# Patient Record
Sex: Female | Born: 1960 | Race: Black or African American | Hispanic: No | Marital: Single | Smoking: Former smoker
Health system: Southern US, Community
[De-identification: ages and names within clinical notes are randomized; demographics above are authoritative.]

## PROBLEM LIST (undated history)

## (undated) DIAGNOSIS — Z860100 Personal history of colon polyps, unspecified: Secondary | ICD-10-CM

## (undated) DIAGNOSIS — R590 Localized enlarged lymph nodes: Secondary | ICD-10-CM

## (undated) DIAGNOSIS — T7840XA Allergy, unspecified, initial encounter: Secondary | ICD-10-CM

## (undated) DIAGNOSIS — R739 Hyperglycemia, unspecified: Secondary | ICD-10-CM

## (undated) DIAGNOSIS — K219 Gastro-esophageal reflux disease without esophagitis: Secondary | ICD-10-CM

## (undated) DIAGNOSIS — Z5189 Encounter for other specified aftercare: Secondary | ICD-10-CM

## (undated) DIAGNOSIS — D649 Anemia, unspecified: Secondary | ICD-10-CM

## (undated) DIAGNOSIS — E785 Hyperlipidemia, unspecified: Secondary | ICD-10-CM

## (undated) DIAGNOSIS — D489 Neoplasm of uncertain behavior, unspecified: Secondary | ICD-10-CM

## (undated) HISTORY — PX: APPENDECTOMY: SHX54

## (undated) HISTORY — DX: Hyperlipidemia, unspecified: E78.5

## (undated) HISTORY — DX: Personal history of colonic polyps: Z86.010

## (undated) HISTORY — PX: COLONOSCOPY: SHX174

## (undated) HISTORY — DX: Allergy, unspecified, initial encounter: T78.40XA

## (undated) HISTORY — DX: Gastro-esophageal reflux disease without esophagitis: K21.9

## (undated) HISTORY — DX: Hyperglycemia, unspecified: R73.9

## (undated) HISTORY — PX: TONSILLECTOMY: SUR1361

## (undated) HISTORY — DX: Anemia, unspecified: D64.9

## (undated) HISTORY — PX: ABDOMINAL HYSTERECTOMY: SHX81

## (undated) HISTORY — DX: Encounter for other specified aftercare: Z51.89

## (undated) HISTORY — DX: Neoplasm of uncertain behavior, unspecified: D48.9

## (undated) HISTORY — DX: Localized enlarged lymph nodes: R59.0

## (undated) HISTORY — DX: Personal history of colon polyps, unspecified: Z86.0100

---

## 1995-03-28 DIAGNOSIS — D489 Neoplasm of uncertain behavior, unspecified: Secondary | ICD-10-CM

## 1995-03-28 HISTORY — PX: PARTIAL COLECTOMY: SHX5273

## 1995-03-28 HISTORY — DX: Neoplasm of uncertain behavior, unspecified: D48.9

## 2001-03-27 HISTORY — PX: FETAL BLOOD TRANSFUSION: SHX1602

## 2001-04-05 ENCOUNTER — Inpatient Hospital Stay (HOSPITAL_COMMUNITY): Admission: EM | Admit: 2001-04-05 | Discharge: 2001-04-09 | Payer: Self-pay | Admitting: Emergency Medicine

## 2001-04-05 ENCOUNTER — Encounter: Payer: Self-pay | Admitting: Emergency Medicine

## 2001-04-16 ENCOUNTER — Encounter: Admission: RE | Admit: 2001-04-16 | Discharge: 2001-04-16 | Payer: Self-pay | Admitting: Family Medicine

## 2005-07-11 ENCOUNTER — Encounter: Admission: RE | Admit: 2005-07-11 | Discharge: 2005-07-11 | Payer: Self-pay | Admitting: Family Medicine

## 2005-07-11 ENCOUNTER — Ambulatory Visit: Payer: Self-pay | Admitting: Family Medicine

## 2005-07-12 ENCOUNTER — Inpatient Hospital Stay (HOSPITAL_COMMUNITY): Admission: EM | Admit: 2005-07-12 | Discharge: 2005-07-18 | Payer: Self-pay | Admitting: Emergency Medicine

## 2005-07-12 ENCOUNTER — Ambulatory Visit: Payer: Self-pay | Admitting: Internal Medicine

## 2005-07-17 ENCOUNTER — Ambulatory Visit: Payer: Self-pay | Admitting: Gastroenterology

## 2005-07-20 ENCOUNTER — Ambulatory Visit: Payer: Self-pay | Admitting: Family Medicine

## 2005-07-28 ENCOUNTER — Ambulatory Visit: Payer: Self-pay | Admitting: Family Medicine

## 2005-09-11 ENCOUNTER — Encounter: Admission: RE | Admit: 2005-09-11 | Discharge: 2005-09-11 | Payer: Self-pay | Admitting: Family Medicine

## 2005-11-03 ENCOUNTER — Ambulatory Visit: Payer: Self-pay | Admitting: Internal Medicine

## 2005-12-15 ENCOUNTER — Ambulatory Visit: Payer: Self-pay | Admitting: Family Medicine

## 2006-04-16 ENCOUNTER — Encounter: Admission: RE | Admit: 2006-04-16 | Discharge: 2006-04-16 | Payer: Self-pay | Admitting: Obstetrics & Gynecology

## 2006-11-09 ENCOUNTER — Encounter: Admission: RE | Admit: 2006-11-09 | Discharge: 2006-11-09 | Payer: Self-pay | Admitting: Obstetrics & Gynecology

## 2007-03-15 ENCOUNTER — Ambulatory Visit: Payer: Self-pay | Admitting: Internal Medicine

## 2007-03-18 ENCOUNTER — Telehealth (INDEPENDENT_AMBULATORY_CARE_PROVIDER_SITE_OTHER): Payer: Self-pay | Admitting: *Deleted

## 2007-03-29 ENCOUNTER — Emergency Department (HOSPITAL_COMMUNITY): Admission: EM | Admit: 2007-03-29 | Discharge: 2007-03-29 | Payer: Self-pay | Admitting: Emergency Medicine

## 2007-03-29 ENCOUNTER — Telehealth (INDEPENDENT_AMBULATORY_CARE_PROVIDER_SITE_OTHER): Payer: Self-pay | Admitting: *Deleted

## 2007-05-06 ENCOUNTER — Telehealth (INDEPENDENT_AMBULATORY_CARE_PROVIDER_SITE_OTHER): Payer: Self-pay | Admitting: *Deleted

## 2007-05-21 ENCOUNTER — Ambulatory Visit: Payer: Self-pay | Admitting: Internal Medicine

## 2007-05-24 ENCOUNTER — Telehealth (INDEPENDENT_AMBULATORY_CARE_PROVIDER_SITE_OTHER): Payer: Self-pay | Admitting: *Deleted

## 2007-05-27 ENCOUNTER — Telehealth: Payer: Self-pay | Admitting: Internal Medicine

## 2007-05-28 ENCOUNTER — Ambulatory Visit: Payer: Self-pay | Admitting: Internal Medicine

## 2007-05-28 DIAGNOSIS — R209 Unspecified disturbances of skin sensation: Secondary | ICD-10-CM

## 2007-08-12 ENCOUNTER — Telehealth (INDEPENDENT_AMBULATORY_CARE_PROVIDER_SITE_OTHER): Payer: Self-pay | Admitting: *Deleted

## 2007-08-13 ENCOUNTER — Ambulatory Visit: Payer: Self-pay | Admitting: Internal Medicine

## 2007-09-20 ENCOUNTER — Encounter: Payer: Self-pay | Admitting: Family Medicine

## 2007-11-08 ENCOUNTER — Ambulatory Visit: Payer: Self-pay | Admitting: Internal Medicine

## 2007-12-13 ENCOUNTER — Ambulatory Visit: Payer: Self-pay | Admitting: *Deleted

## 2007-12-13 DIAGNOSIS — E785 Hyperlipidemia, unspecified: Secondary | ICD-10-CM | POA: Insufficient documentation

## 2007-12-13 DIAGNOSIS — D649 Anemia, unspecified: Secondary | ICD-10-CM

## 2007-12-13 DIAGNOSIS — Z860100 Personal history of colon polyps, unspecified: Secondary | ICD-10-CM | POA: Insufficient documentation

## 2008-01-10 ENCOUNTER — Telehealth (INDEPENDENT_AMBULATORY_CARE_PROVIDER_SITE_OTHER): Payer: Self-pay | Admitting: *Deleted

## 2008-02-25 ENCOUNTER — Ambulatory Visit: Payer: Self-pay | Admitting: Internal Medicine

## 2008-02-25 DIAGNOSIS — S139XXA Sprain of joints and ligaments of unspecified parts of neck, initial encounter: Secondary | ICD-10-CM

## 2008-02-25 DIAGNOSIS — L230 Allergic contact dermatitis due to metals: Secondary | ICD-10-CM | POA: Insufficient documentation

## 2008-03-16 ENCOUNTER — Encounter: Admission: RE | Admit: 2008-03-16 | Discharge: 2008-03-16 | Payer: Self-pay | Admitting: Internal Medicine

## 2008-03-19 ENCOUNTER — Ambulatory Visit: Payer: Self-pay | Admitting: Internal Medicine

## 2008-03-19 DIAGNOSIS — D239 Other benign neoplasm of skin, unspecified: Secondary | ICD-10-CM | POA: Insufficient documentation

## 2008-03-27 HISTORY — PX: COLONOSCOPY: SHX174

## 2008-03-31 ENCOUNTER — Encounter (INDEPENDENT_AMBULATORY_CARE_PROVIDER_SITE_OTHER): Payer: Self-pay | Admitting: *Deleted

## 2008-03-31 ENCOUNTER — Ambulatory Visit: Payer: Self-pay | Admitting: Internal Medicine

## 2008-03-31 LAB — CONVERTED CEMR LAB: OCCULT 2: NEGATIVE

## 2008-04-06 ENCOUNTER — Telehealth (INDEPENDENT_AMBULATORY_CARE_PROVIDER_SITE_OTHER): Payer: Self-pay | Admitting: *Deleted

## 2008-05-11 ENCOUNTER — Telehealth (INDEPENDENT_AMBULATORY_CARE_PROVIDER_SITE_OTHER): Payer: Self-pay | Admitting: *Deleted

## 2008-06-19 ENCOUNTER — Ambulatory Visit: Payer: Self-pay | Admitting: Internal Medicine

## 2008-06-19 LAB — CONVERTED CEMR LAB
Cholesterol, target level: 200 mg/dL
HDL goal, serum: 50 mg/dL
LDL Goal: 130 mg/dL

## 2008-06-24 ENCOUNTER — Telehealth (INDEPENDENT_AMBULATORY_CARE_PROVIDER_SITE_OTHER): Payer: Self-pay | Admitting: *Deleted

## 2008-07-08 ENCOUNTER — Ambulatory Visit: Payer: Self-pay | Admitting: Internal Medicine

## 2008-07-22 ENCOUNTER — Ambulatory Visit: Payer: Self-pay | Admitting: Internal Medicine

## 2008-08-10 ENCOUNTER — Encounter (INDEPENDENT_AMBULATORY_CARE_PROVIDER_SITE_OTHER): Payer: Self-pay | Admitting: *Deleted

## 2008-08-10 ENCOUNTER — Ambulatory Visit: Payer: Self-pay | Admitting: Internal Medicine

## 2008-08-17 ENCOUNTER — Telehealth (INDEPENDENT_AMBULATORY_CARE_PROVIDER_SITE_OTHER): Payer: Self-pay | Admitting: *Deleted

## 2008-08-27 ENCOUNTER — Telehealth (INDEPENDENT_AMBULATORY_CARE_PROVIDER_SITE_OTHER): Payer: Self-pay | Admitting: *Deleted

## 2008-09-07 ENCOUNTER — Ambulatory Visit: Payer: Self-pay | Admitting: Cardiology

## 2008-09-07 ENCOUNTER — Encounter: Payer: Self-pay | Admitting: Internal Medicine

## 2008-09-11 ENCOUNTER — Telehealth (INDEPENDENT_AMBULATORY_CARE_PROVIDER_SITE_OTHER): Payer: Self-pay | Admitting: *Deleted

## 2008-09-18 ENCOUNTER — Encounter: Payer: Self-pay | Admitting: Internal Medicine

## 2008-11-17 ENCOUNTER — Ambulatory Visit: Payer: Self-pay | Admitting: Internal Medicine

## 2008-11-17 DIAGNOSIS — F458 Other somatoform disorders: Secondary | ICD-10-CM

## 2008-11-17 DIAGNOSIS — R5381 Other malaise: Secondary | ICD-10-CM

## 2008-11-17 DIAGNOSIS — M25579 Pain in unspecified ankle and joints of unspecified foot: Secondary | ICD-10-CM | POA: Insufficient documentation

## 2008-11-17 DIAGNOSIS — R5383 Other fatigue: Secondary | ICD-10-CM

## 2008-12-08 ENCOUNTER — Ambulatory Visit: Payer: Self-pay | Admitting: Internal Medicine

## 2008-12-13 LAB — CONVERTED CEMR LAB
Albumin: 4.1 g/dL (ref 3.5–5.2)
CO2: 31 meq/L (ref 19–32)
Cholesterol: 237 mg/dL — ABNORMAL HIGH (ref 0–200)
Direct LDL: 145.6 mg/dL
Eosinophils Absolute: 0 10*3/uL (ref 0.0–0.7)
Eosinophils Relative: 1.1 % (ref 0.0–5.0)
HCT: 43 % (ref 36.0–46.0)
HDL: 78.2 mg/dL (ref >39.00)
Lymphs Abs: 1 10*3/uL (ref 0.7–4.0)
Monocytes Absolute: 0.4 10*3/uL (ref 0.1–1.0)
Neutro Abs: 2.4 10*3/uL (ref 1.4–7.7)
Neutrophils Relative %: 62 % (ref 43.0–77.0)
Platelets: 189 10*3/uL (ref 150.0–400.0)
RDW: 13.2 % (ref 11.5–14.6)
Sodium: 141 meq/L (ref 135–145)
TSH: 0.96 microintl units/mL (ref 0.35–5.50)
Total CHOL/HDL Ratio: 3
Total Protein: 7.6 g/dL (ref 6.0–8.3)
Triglycerides: 59 mg/dL (ref 0.0–149.0)
WBC: 3.8 10*3/uL — ABNORMAL LOW (ref 4.5–10.5)

## 2008-12-14 ENCOUNTER — Encounter (INDEPENDENT_AMBULATORY_CARE_PROVIDER_SITE_OTHER): Payer: Self-pay | Admitting: *Deleted

## 2008-12-14 ENCOUNTER — Telehealth (INDEPENDENT_AMBULATORY_CARE_PROVIDER_SITE_OTHER): Payer: Self-pay | Admitting: *Deleted

## 2008-12-24 ENCOUNTER — Ambulatory Visit: Payer: Self-pay | Admitting: Internal Medicine

## 2009-01-21 ENCOUNTER — Ambulatory Visit: Payer: Self-pay | Admitting: Internal Medicine

## 2009-01-21 DIAGNOSIS — M2550 Pain in unspecified joint: Secondary | ICD-10-CM | POA: Insufficient documentation

## 2009-01-21 DIAGNOSIS — R590 Localized enlarged lymph nodes: Secondary | ICD-10-CM

## 2009-01-21 DIAGNOSIS — R079 Chest pain, unspecified: Secondary | ICD-10-CM

## 2009-01-21 HISTORY — DX: Localized enlarged lymph nodes: R59.0

## 2009-01-22 ENCOUNTER — Ambulatory Visit: Payer: Self-pay | Admitting: Internal Medicine

## 2009-01-22 ENCOUNTER — Telehealth (INDEPENDENT_AMBULATORY_CARE_PROVIDER_SITE_OTHER): Payer: Self-pay | Admitting: *Deleted

## 2009-01-22 ENCOUNTER — Encounter: Payer: Self-pay | Admitting: Internal Medicine

## 2009-01-22 ENCOUNTER — Encounter (INDEPENDENT_AMBULATORY_CARE_PROVIDER_SITE_OTHER): Payer: Self-pay | Admitting: *Deleted

## 2009-01-22 DIAGNOSIS — R930 Abnormal findings on diagnostic imaging of skull and head, not elsewhere classified: Secondary | ICD-10-CM

## 2009-01-22 LAB — CONVERTED CEMR LAB
Relative Index: 1 (ref 0.0–2.5)
Sed Rate: 14 mm/hr (ref 0–22)
Total CK: 136 units/L (ref 7–177)

## 2009-01-25 ENCOUNTER — Encounter (INDEPENDENT_AMBULATORY_CARE_PROVIDER_SITE_OTHER): Payer: Self-pay | Admitting: *Deleted

## 2009-01-26 ENCOUNTER — Encounter (INDEPENDENT_AMBULATORY_CARE_PROVIDER_SITE_OTHER): Payer: Self-pay | Admitting: *Deleted

## 2009-02-16 ENCOUNTER — Telehealth (INDEPENDENT_AMBULATORY_CARE_PROVIDER_SITE_OTHER): Payer: Self-pay | Admitting: *Deleted

## 2009-03-01 ENCOUNTER — Telehealth (INDEPENDENT_AMBULATORY_CARE_PROVIDER_SITE_OTHER): Payer: Self-pay | Admitting: *Deleted

## 2009-03-04 ENCOUNTER — Ambulatory Visit: Payer: Self-pay | Admitting: Internal Medicine

## 2009-03-04 DIAGNOSIS — R599 Enlarged lymph nodes, unspecified: Secondary | ICD-10-CM | POA: Insufficient documentation

## 2009-03-22 ENCOUNTER — Telehealth (INDEPENDENT_AMBULATORY_CARE_PROVIDER_SITE_OTHER): Payer: Self-pay | Admitting: *Deleted

## 2009-04-13 ENCOUNTER — Ambulatory Visit: Payer: Self-pay | Admitting: Family Medicine

## 2009-04-13 DIAGNOSIS — J069 Acute upper respiratory infection, unspecified: Secondary | ICD-10-CM | POA: Insufficient documentation

## 2009-04-28 ENCOUNTER — Telehealth (INDEPENDENT_AMBULATORY_CARE_PROVIDER_SITE_OTHER): Payer: Self-pay | Admitting: *Deleted

## 2009-04-29 ENCOUNTER — Encounter: Payer: Self-pay | Admitting: Family Medicine

## 2009-04-30 ENCOUNTER — Encounter: Payer: Self-pay | Admitting: Family Medicine

## 2009-05-17 ENCOUNTER — Encounter (INDEPENDENT_AMBULATORY_CARE_PROVIDER_SITE_OTHER): Payer: Self-pay | Admitting: *Deleted

## 2009-05-17 ENCOUNTER — Ambulatory Visit: Payer: Self-pay | Admitting: Internal Medicine

## 2009-05-18 ENCOUNTER — Telehealth: Payer: Self-pay | Admitting: Internal Medicine

## 2009-05-19 ENCOUNTER — Ambulatory Visit: Payer: Self-pay | Admitting: Internal Medicine

## 2009-05-19 ENCOUNTER — Telehealth (INDEPENDENT_AMBULATORY_CARE_PROVIDER_SITE_OTHER): Payer: Self-pay | Admitting: *Deleted

## 2009-05-19 ENCOUNTER — Telehealth: Payer: Self-pay | Admitting: Internal Medicine

## 2009-05-20 ENCOUNTER — Telehealth (INDEPENDENT_AMBULATORY_CARE_PROVIDER_SITE_OTHER): Payer: Self-pay | Admitting: *Deleted

## 2009-07-14 ENCOUNTER — Telehealth: Payer: Self-pay | Admitting: Internal Medicine

## 2009-09-29 ENCOUNTER — Telehealth: Payer: Self-pay | Admitting: Internal Medicine

## 2010-03-29 ENCOUNTER — Ambulatory Visit
Admission: RE | Admit: 2010-03-29 | Discharge: 2010-03-29 | Payer: Self-pay | Source: Home / Self Care | Attending: Internal Medicine | Admitting: Internal Medicine

## 2010-03-29 ENCOUNTER — Other Ambulatory Visit: Payer: Self-pay | Admitting: Internal Medicine

## 2010-03-29 DIAGNOSIS — M26629 Arthralgia of temporomandibular joint, unspecified side: Secondary | ICD-10-CM | POA: Insufficient documentation

## 2010-03-29 DIAGNOSIS — L659 Nonscarring hair loss, unspecified: Secondary | ICD-10-CM | POA: Insufficient documentation

## 2010-03-30 LAB — T4, FREE: Free T4: 0.86 ng/dL (ref 0.60–1.60)

## 2010-03-30 LAB — TSH: TSH: 0.79 u[IU]/mL (ref 0.35–5.50)

## 2010-04-24 LAB — CONVERTED CEMR LAB
ALT: 36 units/L — ABNORMAL HIGH (ref 0–35)
AST: 29 units/L (ref 0–37)
Albumin: 4.5 g/dL (ref 3.5–5.2)
Alkaline Phosphatase: 74 units/L (ref 39–117)
Basophils Absolute: 0 10*3/uL (ref 0.0–0.1)
CO2: 24 meq/L (ref 19–32)
Glucose, Bld: 83 mg/dL (ref 70–99)
HCT: 43.5 % (ref 36.0–46.0)
Hemoglobin: 15.3 g/dL — ABNORMAL HIGH (ref 12.0–15.0)
Indirect Bilirubin: 1 mg/dL — ABNORMAL HIGH (ref 0.0–0.9)
Lymphs Abs: 1.6 10*3/uL (ref 0.7–4.0)
MCV: 81.5 fL (ref 78.0–100.0)
Monocytes Relative: 9 % (ref 3–12)
Neutro Abs: 2.9 10*3/uL (ref 1.7–7.7)
Neutrophils Relative %: 57 % (ref 43–77)
Platelets: 283 10*3/uL (ref 150–400)
RBC: 5.34 M/uL — ABNORMAL HIGH (ref 3.87–5.11)
RDW: 14.2 % (ref 11.5–15.5)
TSH: 2.292 microintl units/mL (ref 0.350–4.50)
Total Protein: 8.2 g/dL (ref 6.0–8.3)
Troponin I: <0.01 ng/mL (ref <0.06)
WBC: 5.1 10*3/uL (ref 4.0–10.5)

## 2010-04-26 NOTE — Progress Notes (Signed)
Summary: Triage> Medication Concerns  Phone Note Call from Patient Call back at Home Phone [phone]   Caller: Patient Summary of Call: Message left on VM: Patient would like a call to discuss depression medication   Chrae [facility]  September 29, 2009 10:20 AM   Follow-up for Phone Call        lmtcb for follow-up. Follow-up by: Harold Barban,  September 30, 2009 1:13 PM  Additional Follow-up for Phone Call Additional follow up Details #1::        Patient called and would like to come off of her depression meds. She is on Citalopram 20mg . Please advise.  Additional Follow-up by: Harold Barban,  September 30, 2009 3:05 PM    Additional Follow-up for Phone Call Additional follow up Details #2::    decrease by 1/2 pill once daily X 5 days then D/C Follow-up by: Marga Melnick MD,  October 01, 2009 4:06 AM  Additional Follow-up for Phone Call Additional follow up Details #3:: Details for Additional Follow-up Action Taken: Patient notified and notes that she has been up since 12:50 last night. Patient is aware to call back to the office if she has any issues with the above. Additional Follow-up by: Lucious Groves,  October 01, 2009 4:06 PM

## 2010-04-26 NOTE — Medication Information (Signed)
Summary: Prior Authorization & Approval for Clarinex/[employer]  Prior Authorization & Approval for Clarinex/[employer]   Imported By: Lanelle Bal 05/07/2009 09:52:10  _____________________________________________________________________  External Attachment:    Type:   Image     Comment:   External Document

## 2010-04-26 NOTE — Letter (Signed)
Summary: Work Dietitian at [employer]  [address] [place], [place] [address]   Phone: [phone]  Fax: [phone]    Today's Date: May 17, 2009  Name of Patient: [name]  The above named patient had a medical visit today at:  am /2:30 pm.  Please take this into consideration when reviewing the time away from work/school.    Special Instructions:  [ x ] None  [  ] To be off the remainder of today, returning to the normal work / school schedule tomorrow.  [  ] To be off until the next scheduled appointment on ______________________.  [  ] Other ________________________________________________________________ ________________________________________________________________________   Sincerely yours,   Shonna Chock

## 2010-04-26 NOTE — Progress Notes (Signed)
Summary: Refill Request  Phone Note Refill Request Message from:  Pharmacy on [facility] [address]  Refills Requested: Medication #1:  VERAMYST 27.5 MCG/SPRAY SUSP 2 sprays each nostril once daily   Dosage confirmed as above?Dosage Confirmed   Last Refilled: 04/28/2009 Initial call taken by: Harold Barban,  April 28, 2009 3:47 PM  Follow-up for Phone Call        needs prior auth will fax over numbers to call Army Fossa CMA  April 29, 2009 11:52 AM

## 2010-04-26 NOTE — Progress Notes (Signed)
Summary: Antibiotic Concerns  Phone Note Call from Patient Call back at Home Phone [phone]   Caller: Patient Summary of Call: Message left on VM: Patient was rx'ed antibiotic LEVAQUIN 500 MG TABS for sinusitis (see CT Report), patient states this med is expensive and she would like it changed to something different.   I called patient and offered her a 25 dollar saving coupon and she said thats an option IF there is not another Antibiotic that would be cheaper. Patient aware Dr.Hopper is out of the office and I will consult with one of the other Dr.'s and contact her back later./Chrae [facility]  May 20, 2009 9:16 AM   Follow-up for Phone Call        Patient called back and stated she will just stop by and get the 25 dollar savings coupon. Placed at the front for pick-up Follow-up by: Shonna Chock,  May 20, 2009 9:21 AM

## 2010-04-26 NOTE — Progress Notes (Signed)
Summary: refill-hopp  Phone Note Call from Patient Call back at Home Phone [phone]   Caller: Patient Summary of Call: pt states that she is currently having back spasm again and would for dr Arris Meyn to refill med. pt last OV 05-17-09, last filled 06-24-08 #15 1 and removed from list 11-17-08  CYCLOBENZAPRINE HCL 5 MG TABS 1 at bedtime as needed for muscle spasm. [facility] [address]  Initial call taken by: Jeremy Johann CMA,  July 14, 2009 4:20 PM  Follow-up for Phone Call        1-2 at bedtime as needed #14 Follow-up by: Marga Melnick MD,  July 14, 2009 5:03 PM  Additional Follow-up for Phone Call Additional follow up Details #1::        pt left detail messsage rx sent to pharmacy ....................Marland KitchenFelecia Deloach CMA  July 14, 2009 5:08 PM     New/Updated Medications: CYCLOBENZAPRINE HCL 5 MG TABS (CYCLOBENZAPRINE HCL) take 1-2 at bedtime as needed Prescriptions: CYCLOBENZAPRINE HCL 5 MG TABS (CYCLOBENZAPRINE HCL) take 1-2 at bedtime as needed  #14 x 0   Entered by:   Jeremy Johann CMA   Authorized by:   Marga Melnick MD   Signed by:   Jeremy Johann CMA on 07/14/2009   Method used:   [facility]ed to ...       [facility] [phone]* (retail)       [address]       [place], [place]  [address]       Ph: [phone]       Fax: [phone]   RxID:   [id number]

## 2010-04-26 NOTE — Progress Notes (Signed)
Summary: [facility] Concerns  Phone Note Call from Patient Call back at Home Phone [phone]   Caller: Patient Summary of Call: Patient woke up today and her mouth is sore/ right sinus cavity region is very tender. Patient was seen yesterday and ?'s if there is anything futher for her to do or is this something to expect.   Dr.Jeffrie Lofstrom please advise  Initial call taken by: Shonna Chock,  May 18, 2009 11:40 AM  Follow-up for Phone Call        Use Neti pot once daily ;Fluticasone two times a day as we discussed.Zicam  or Zinc lozenges for sore throat   Follow-up by: Marga Melnick MD,  May 18, 2009 12:16 PM  Additional Follow-up for Phone Call Additional follow up Details #1::        pt aware.................Marland KitchenFelecia Deloach CMA  May 18, 2009 12:29 PM

## 2010-04-26 NOTE — Progress Notes (Signed)
Summary: Prior Auth APPROVED clarinex, veramyst [employer]  Phone Note Refill Request Message from:  Pharmacy on [address] Fax # [phone]  Refills Requested: Medication #1:  CLARINEX 5 MG TABS 1 by mouth once daily as needed.   Dosage confirmed as above?Dosage Confirmed  Medication #2:  VERAMYST 27.5 MCG/SPRAY SUSP 2 sprays each nostril once daily Prior Auth [id number]  Initial call taken by: Harold Barban,  April 28, 2009 3:48 PM  Follow-up for Phone Call        prior auth in process [employer] awaiting fax.............Marland KitchenFelecia Deloach CMA  April 29, 2009 1:19 PM   Additional Follow-up for Phone Call Additional follow up Details #1::        prior auth approved veramyst from 04-09-09 until 04-30-11 prior auth approved clarinex from 04-09-09 until 04-30-10 pharmacy faxed, approval letter scan to chart..............Marland KitchenFelecia Deloach CMA  May 03, 2009 9:16 AM

## 2010-04-26 NOTE — Progress Notes (Signed)
Summary: still no better facial pain and tenderness  Phone Note Call from Patient Call back at Home Phone [phone]   Caller: Patient Summary of Call: pt c/o tenderness, pain, in right eye and sinus cavity region . pt states pain increase with mouth movement, and touch. pt has tried ibuprofen,Neti pot once daily ;Fluticasone two times a day with no relief of pain. dr Jazz Biddy pls advise..................................Marland KitchenFelecia Deloach CMA  May 19, 2009 9:47 AM   Follow-up for Phone Call        Stat CT scan of sinuses Follow-up by: Marga Melnick MD,  May 19, 2009 2:43 PM  Additional Follow-up for Phone Call Additional follow up Details #1::        pt aware, CT ordered.................Marland KitchenFelecia Deloach CMA  May 19, 2009 2:55 PM   New Problems: SINUS PAIN (ICD-478.19)   New Problems: SINUS PAIN (ICD-478.19)

## 2010-04-26 NOTE — Progress Notes (Signed)
Summary: nose spray/clainex  Phone Note Call from Patient   Summary of Call: Pt called and stated she would like a refill on the nose spray and the clarinex- I told her I would send in refills but if she was not better by the end of this rx she would need to be seen agian. Army Fossa CMA  April 28, 2009 1:39 PM     Prescriptions: CLARINEX 5 MG TABS (DESLORATADINE) 1 by mouth once daily as needed  #30 x 0   Entered by:   Army Fossa CMA   Authorized by:   Loreen Freud DO   Signed by:   Army Fossa CMA on 04/28/2009   Method used:   Electronically to        [facility] [address] (retail)       [phone]       [place], [place]  [address]       Ph: [phone]       Fax: [phone]   RxID:   [id number] VERAMYST 27.5 MCG/SPRAY SUSP (FLUTICASONE FUROATE) 2 sprays each nostril once daily  #1 x 0   Entered by:   Army Fossa CMA   Authorized by:   Loreen Freud DO   Signed by:   Army Fossa CMA on 04/28/2009   Method used:   Faxed to ...       [facility] [address] (retail)       [phone]       [place], [place]  [address]       Ph: [phone]       Fax: [phone]   RxID:   [id number]

## 2010-04-26 NOTE — Progress Notes (Signed)
Summary: CT Results  Phone Note Call from Patient Call back at Home Phone [phone]   Caller: Patient Summary of Call: Spoke with, patient aware of the instructions below:  Sinusitis: Change Augmentin to Levaquin 500 mg once daily #7; also Prednisone 20 mg two times a day with food #10  Chrae Malloy  May 19, 2009 5:09 PM      New/Updated Medications: LEVAQUIN 500 MG TABS (LEVOFLOXACIN) 1 by mouth once daily x 7days PREDNISONE 20 MG TABS (PREDNISONE) 1 by mouth two times a day Prescriptions: LEVAQUIN 500 MG TABS (LEVOFLOXACIN) 1 by mouth once daily x 7days  #7 x 0   Entered by:   Shonna Chock   Authorized by:   Marga Melnick MD   Signed by:   Shonna Chock on 05/19/2009   Method used:   Electronically to        [facility] [address] [place] (retail)       [phone]       [place], [place]  [address]       Ph: [phone]       Fax: [phone]   RxID:   [id number] PREDNISONE 20 MG TABS (PREDNISONE) 1 by mouth two times a day  #10 x 0   Entered by:   Shonna Chock   Authorized by:   Marga Melnick MD   Signed by:   Shonna Chock on 05/19/2009   Method used:   Electronically to        [facility] [address] [place] (retail)       [phone]       [place], [place]  [address]       Ph: [phone]       Fax: [phone]   RxID:   [id number]

## 2010-04-26 NOTE — Assessment & Plan Note (Signed)
Summary: headache.nausea.left earache/kdc   Vital Signs:  Patient profile:   50 year old female Weight:      164 pounds Temp:     98.6 degrees F oral Pulse rate:   72 / minute Pulse rhythm:   regular BP sitting:   116 / 80  (left arm) Cuff size:   regular  Vitals Entered By: Army Fossa CMA (April 13, 2009 1:58 PM) CC: Pt c/o headache, nausea, ear ache, chills since saturday. , URI symptoms   History of Present Illness:       This is a 51 year old woman who presents with URI symptoms.  The symptoms began 4 days ago.  The patient complains of nasal congestion, clear nasal discharge, dry cough, and earache, but denies purulent nasal discharge.  The patient denies fever, low-grade fever (<100.5 degrees), fever of 100.5-103 degrees, fever of 103.1-104 degrees, fever to >104 degrees, stiff neck, dyspnea, wheezing, rash, vomiting, diarrhea, use of an antipyretic, and response to antipyretic.  The patient also reports itchy throat and sneezing.  The patient denies itchy watery eyes, seasonal symptoms, response to antihistamine, headache, muscle aches, and severe fatigue.  The patient denies the following risk factors for Strep sinusitis: unilateral facial pain, unilateral nasal discharge, poor response to decongestant, double sickening, tooth pain, Strep exposure, tender adenopathy, and absence of cough.    Current Medications (verified): 1)  Sinus Wash Neti Pot 2300-700 Mg  Kit (Sodium Chloride-Sodium Bicarb) .... Prn 2)  Citalopram Hydrobromide 20 Mg Tabs (Citalopram Hydrobromide) .Marland Kitchen.. 1 Once Daily 3)  Tylenol Pm .... As Needed 4)  Zyrtec .... Take As Needed 5)  Divigel 0.25 Mg/0.25gm Gel (Estradiol) .... Rub On Upper Thigh Once Daily 6)  Augmentin 875-125 Mg Tabs (Amoxicillin-Pot Clavulanate) .Marland Kitchen.. 1 By Mouth Two Times A Day 7)  Veramyst 27.5 Mcg/spray Susp (Fluticasone Furoate) .... 2 Sprays Each Nostril Once Daily 8)  Clarinex 5 Mg Tabs (Desloratadine) .Marland Kitchen.. 1 By Mouth Once Daily As  Needed  Allergies: 1)  ! Codeine  Past History:  Past medical, surgical, family and social histories (including risk factors) reviewed for relevance to current acute and chronic problems.  Past Medical History: Reviewed history from 03/04/2009 and no changes required. 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 Hysterectomy & USO-04/2001 for fibroid & severe anemia; Blood transfusion -03/2001 pre TAH; Colon-polyp-1997, 2004;negative  2010, [facility] Tonsillectomy ; SBO 2007 Colectomy, partial for polyp 1997  Family History: Reviewed history from 03/04/2009 and no changes required. Father: colon CA,leukemia Mother: neg Siblings: bro lipids; P uncle DM;MGaunt colon CA Asthma- MGM  Social History: Reviewed history from 03/04/2009 and no changes required. Occupation: Runner, broadcasting/film/video Single Alcohol use-no Regular exercise-yes Former smoker.  Quit in 2003. Smoked on and off x 10 year.  Smoked socially.  Review of Systems      See HPI  Physical Exam  General:  Well-developed,well-nourished,in no acute distress; alert,appropriate and cooperative throughout examination Ears:  External ear exam shows no significant lesions or deformities.  Otoscopic examination reveals clear canals, tympanic membranes are intact bilaterally without bulging, retraction, inflammation or discharge. Hearing is grossly normal bilaterally. Nose:  L frontal sinus tenderness, L maxillary sinus tenderness, R frontal sinus tenderness, and R maxillary sinus tenderness.   Mouth:  Oral mucosa and oropharynx without lesions or  exudates.  Teeth in good repair. Neck:  No deformities, masses, or tenderness noted. Lungs:  Normal respiratory effort, chest expands  symmetrically. Lungs are clear to auscultation, no crackles or wheezes. Heart:  normal rate and no murmur.   Psych:  Cognition and judgment appear intact. Alert and cooperative with normal attention span and concentration. No apparent delusions, illusions, hallucinations   Impression & Recommendations:  Problem # 1:  URI (ICD-465.9)  Instructed on symptomatic treatment. Call if symptoms persist or worsen.   Her updated medication list for this problem includes:    Clarinex 5 Mg Tabs (Desloratadine) .Marland Kitchen... 1 by mouth once daily as needed      veramyst if no better in 3-4 days---augmentin 875 two times a day for 10 days   Complete Medication List: 1)  Sinus Wash Neti Pot 2300-700 Mg Kit (Sodium chloride-sodium bicarb) .... Prn 2)  Citalopram Hydrobromide 20 Mg Tabs (Citalopram hydrobromide) .Marland Kitchen.. 1 once daily 3)  Tylenol Pm  .... As needed 4)  Zyrtec  .... Take as needed 5)  Divigel 0.25 Mg/0.25gm Gel (Estradiol) .... Rub on upper thigh once daily 6)  Augmentin 875-125 Mg Tabs (Amoxicillin-pot clavulanate) .Marland Kitchen.. 1 by mouth two times a day 7)  Veramyst 27.5 Mcg/spray Susp (Fluticasone furoate) .... 2 sprays each nostril once daily 8)  Clarinex 5 Mg Tabs (Desloratadine) .Marland Kitchen.. 1 by mouth once daily as needed Prescriptions: AUGMENTIN 875-125 MG TABS (AMOXICILLIN-POT CLAVULANATE) 1 by mouth two times a day  #20 x 0   Entered and Authorized by:   Loreen Freud DO   Signed by:   Loreen Freud DO on 04/13/2009   Method used:   Print then Give to Patient   RxID:   [id number]

## 2010-04-26 NOTE — Assessment & Plan Note (Signed)
Summary: FLU?KDC   Vital Signs:  Patient profile:   50 year old female Weight:      164.6 pounds Temp:     98.3 degrees F oral Pulse rate:   80 / minute Resp:     14 per minute BP sitting:   100 / 62  (left arm) Cuff size:   large  Vitals Entered By: Shonna Chock (May 17, 2009 3:06 PM) CC: Cough,fever(off/on), body aches, sore throat, discolored drainage headache and diarrhea. Patient states "I feel like Crap" Comments REVIEWED MED LIST, PATIENT AGREED DOSE AND INSTRUCTION CORRECT    Primary Care Provider:  Marga Melnick MD  CC:  Cough, fever(off/on), body aches, sore throat, and discolored drainage headache and diarrhea. Patient states "I feel like Crap".  History of Present Illness: Onset 05/11/2009 as NP cough; 02/18 with yellow sputum & L earache. LB aching w/o myalgias. Rx: Robitussin , Tylenol  Allergies: 1)  ! Codeine  Review of Systems General:  Complains of chills, fever, and sweats. Eyes:  Denies discharge. ENT:  Complains of nasal congestion and sinus pressure; denies ear discharge; Frontal headache, facial pain , dental pain & purulence. Resp:  Complains of sputum productive and wheezing; denies shortness of breath; No PMH of asthma; former smoker.  Physical Exam  General:  in no acute distress; alert,appropriate and cooperative throughout examination Eyes:  No corneal or conjunctival inflammation noted. EOMI. Perrla. Vision grossly normal. Ears:  External ear exam shows no significant lesions or deformities.  Otoscopic examination reveals clear canals, tympanic membranes are intact bilaterally without bulging, retraction, inflammation or discharge. Hearing is grossly normal bilaterally. Nose:  External nasal examination shows no deformity or inflammation. R nare occluded Mouth:  Oral mucosa and oropharynx without lesions or exudates.  Teeth in good repair. Mild pharyngeal erythema.   Lungs:  Normal respiratory effort, chest expands symmetrically. Lungs are  clear to auscultation, no crackles or wheezes. Dry cough Heart:  regular rhythm, no murmur, and bradycardia.   Cervical Nodes:  No lymphadenopathy noted Axillary Nodes:  No palpable lymphadenopathy   Impression & Recommendations:  Problem # 1:  SINUSITIS- ACUTE-NOS (ICD-461.9)  The following medications were removed from the medication list:    Veramyst 27.5 Mcg/spray Susp (Fluticasone furoate) .Marland Kitchen... 2 sprays each nostril once daily Her updated medication list for this problem includes:    Sinus Wash Neti Pot 2300-700 Mg Kit (Sodium chloride-sodium bicarb) .Marland Kitchen... Prn    Amoxicillin-pot Clavulanate 875-125 Mg Tabs (Amoxicillin-pot clavulanate) ..... Q 12 hrs with a meal    Fluticasone Propionate 50 Mcg/act Susp (Fluticasone propionate) .Marland Kitchen... 1 spray two times a day with "crossovver " technique  Problem # 2:  BRONCHITIS-ACUTE (ICD-466.0)  Her updated medication list for this problem includes:    Amoxicillin-pot Clavulanate 875-125 Mg Tabs (Amoxicillin-pot clavulanate) ..... Q 12 hrs with a meal  Complete Medication List: 1)  Sinus Wash Neti Pot 2300-700 Mg Kit (Sodium chloride-sodium bicarb) .... Prn 2)  Citalopram Hydrobromide 20 Mg Tabs (Citalopram hydrobromide) .Marland Kitchen.. 1 once daily 3)  Tylenol Pm  .... As needed 4)  Zyrtec  .... Take as needed 5)  Divigel 0.25 Mg/0.25gm Gel (Estradiol) .... Rub on upper thigh once daily 6)  Amoxicillin-pot Clavulanate 875-125 Mg Tabs (Amoxicillin-pot clavulanate) .... Q 12 hrs with a meal 7)  Fluticasone Propionate 50 Mcg/act Susp (Fluticasone propionate) .Marland Kitchen.. 1 spray two times a day with "crossovver " technique  Patient Instructions: 1)  Neti pot once daily untilsinuses clear. 2)  Drink as much fluid  as you can tolerate for the next few days. 3)  Recommended remaining out of work for  02/21 & 05/18/2009 Prescriptions: FLUTICASONE PROPIONATE 50 MCG/ACT SUSP (FLUTICASONE PROPIONATE) 1 spray two times a day with "crossovver " technique  #1 x 11    Entered and Authorized by:   Marga Melnick MD   Signed by:   Marga Melnick MD on 05/17/2009   Method used:   Faxed to ...       [facility] [phone]* (retail)       [address]       [place], [place]  [address]       Ph: [phone]       Fax: [phone]   RxID:   [id number] AMOXICILLIN-POT CLAVULANATE 875-125 MG TABS (AMOXICILLIN-POT CLAVULANATE) q 12 hrs with a meal  #20 x 0   Entered and Authorized by:   Marga Melnick MD   Signed by:   Marga Melnick MD on 05/17/2009   Method used:   Faxed to ...       [facility] [phone]* (retail)       [address]       [place], [place]  [address]       Ph: [phone]       Fax: [phone]   RxID:   [id number]

## 2010-04-26 NOTE — Medication Information (Signed)
Summary: Prior Authorization & Approval for Veramyst/[employer]  Prior Authorization & Approval for Veramyst/[employer]   Imported By: Lanelle Bal 05/07/2009 09:58:46  _____________________________________________________________________  External Attachment:    Type:   Image     Comment:   External Document

## 2010-04-28 NOTE — Assessment & Plan Note (Signed)
Summary: EARACHE/KN   Vital Signs:  Patient profile:   50 year old female Weight:      168.2 pounds BMI:     28.53 Temp:     98.6 degrees F oral Pulse rate:   72 / minute Resp:     14 per minute BP sitting:   118 / 80  (left arm) Cuff size:   large  Vitals Entered By: Shonna Chock CMA (March 29, 2010 2:53 PM) CC: Left earache x 1 month and discuss citalopram, Ear pain   Primary Care Provider:  Marga Melnick MD  CC:  Left earache x 1 month and discuss citalopram and Ear pain.  History of Present Illness:      This is a 50 year old woman who presents with Ear pain X 1 month.  The patient also reports tinnitus and clear  nasal discharge, but denies ear discharge, sensation of fullness, hearing loss, fever, and sinus pain.  The pain is located in the left ear.  The pain is described as constant.  Associated symptoms include toothache on l .  The patient denies headache.   Rx: ear drops for wax w/o benefit.  Allergies: 1)  ! Codeine  Review of Systems Derm:  Complains of hair loss; denies changes in nail beds and dryness. Neuro:  Denies numbness and tingling. Psych:  Complains of depression.  Physical Exam  General:  well-nourished,in no acute distress; alert,appropriate and cooperative throughout examination Ears:  R ear normal.  L canal with minimal wax. 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 Nasal mucosa are  dry without lesions or exudates. R nare occluded Mouth:  Oral mucosa and oropharynx without lesions or exudates.  Teeth in good repair.  Minimal  uvular  erythema.   Neck:  No deformities, masses, or tenderness noted. Extremities:  No clubbing, cyanosis, edema. No tremor Skin:  Intact without suspicious lesions or rashes Cervical Nodes:  No lymphadenopathy noted Axillary Nodes:  No palpable lymphadenopathy   Impression & Recommendations:  Problem # 1:  TMJ PAIN (ICD-524.62) Assessment Improved  Problem # 2:  URI  (ICD-465.9) extrinsic etiology suggested rather than infectious  Problem # 3:  HAIR LOSS (ICD-704.00)  R/O thyroid dysfunction   Orders: Venipuncture ([address]109) TLB-TSH (Thyroid Stimulating Hormone) (84443-TSH) TLB-T4 (Thyrox), Free [phone])  Complete Medication List: 1)  Sinus Wash Neti Pot 2300-700 Mg Kit (Sodium chloride-sodium bicarb) .... Prn 2)  Citalopram Hydrobromide 20 Mg Tabs (Citalopram hydrobromide) .Marland Kitchen.. 1 once daily 3)  Tylenol Pm  .... As needed 4)  Zyrtec  .... Take as needed 5)  Divigel 0.25 Mg/0.25gm Gel (Estradiol) .... Rub on upper thigh once daily 6)  Fluticasone Propionate 50 Mcg/act Susp (Fluticasone propionate) .Marland Kitchen.. 1 spray two times a day with "crossovver " technique 7)  Oxycodone-acetaminophen 7.5-325 Mg Tabs (Oxycodone-acetaminophen) .Marland Kitchen.. 1 by mouth every 6 hours as needed  Patient Instructions: 1)  Aleve 1-2 every 8-12 hrs as needed . Warm compresses to L jaw three times a day as needed . Avoid tough meat & chewing gum. 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 Netipot once daily - two times a day as needed , followed by Fluticasone. Report pain , pus & fever as discussed.   Orders Added: 1)  Est. Patient Level III [[address]] 2)  Venipuncture [36415] 3)  TLB-TSH (Thyroid Stimulating Hormone) [84443-TSH] 4)  TLB-T4 (Thyrox), Free [82956-OZ3Y]  Appended Document: EARACHE/KN

## 2010-05-16 ENCOUNTER — Telehealth: Payer: Self-pay | Admitting: Internal Medicine

## 2010-05-23 ENCOUNTER — Telehealth: Payer: Self-pay | Admitting: Internal Medicine

## 2010-05-24 NOTE — Progress Notes (Signed)
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 Patient notes that this is not helping and she is hurting pretty bad today. Patient would like prescription called in. Please advise.  Initial call taken by: Lucious Groves CMA,  May 16, 2010 11:15 AM  Follow-up for Phone Call         Does codeine causes nausea ? Follow-up by: Marga Melnick MD,  May 16, 2010 1:57 PM  Additional Follow-up for Phone Call Additional follow up Details #1::        I called patient and the above and she notes that it did in the past but she thinks it was due to not having anything on her stomach. She notes that she taken Hydrocodone without a problem. Lucious Groves CMA  May 16, 2010 2:29 PM     Additional Follow-up for Phone Call Additional follow up Details #2::    Tylenol #3  # 30 1 every 4 hrs as needed  Follow-up by: Marga Melnick MD,  May 16, 2010 4:40 PM  Additional Follow-up for Phone Call Additional follow up Details #3:: Details for Additional Follow-up Action Taken: Patient notified. Lucious Groves CMA  May 16, 2010 4:46 PM   New/Updated Medications: TYLENOL WITH CODEINE #3 300-30 MG TABS (ACETAMINOPHEN-CODEINE) 1 by mouth q 4 hrs as needed Prescriptions: TYLENOL WITH CODEINE #3 300-30 MG TABS (ACETAMINOPHEN-CODEINE) 1 by mouth q 4 hrs as needed  #30 x 0   Entered by:   Lucious Groves CMA   Authorized by:   Marga Melnick MD   Signed by:   Lucious Groves CMA on 05/16/2010   Method used:   Printed then faxed to ...       [facility] [phone]* (retail)       [address]       [place], [place]  [address]       Ph: [phone]       Fax: [phone]   RxID:   [id number]

## 2010-06-02 NOTE — Progress Notes (Signed)
Summary: Med issue  Phone Note Call from Patient Call back at Home Phone [phone]   Summary of Call: Patient called noting that the prescription sent for her makes her to sick to take it (Tylenol 3). Patient notes that she has a great deal of nausea with the med despite having taken it with food. She also notes that she now has a clicking noise in her jaw. Please advise. Initial call taken by: Lucious Groves CMA,  May 23, 2010 11:54 AM  Follow-up for Phone Call        ear/ jaw symptoms present > 1 month . ENT consulatation indicated Follow-up by: Marga Melnick MD,  May 23, 2010 12:48 PM  Additional Follow-up for Phone Call Additional follow up Details #1::        Patient notified. Additional Follow-up by: Lucious Groves CMA,  May 23, 2010 12:54 PM

## 2010-06-10 ENCOUNTER — Telehealth: Payer: Self-pay | Admitting: Internal Medicine

## 2010-06-13 ENCOUNTER — Telehealth: Payer: Self-pay

## 2010-06-14 NOTE — Progress Notes (Signed)
Summary: rx for TMJ  Phone Note Call from Patient   Caller: Patient Summary of Call: Spoke w/ patient still having trouble w/ TMJ and having trouble finding someone to treat is in the process of contacting insurance to see which oral surgeon would be in network. In the meantime would like to know if Dr. Alwyn Ren would prescrib a muscle relaxer or something that will help w/ discomfort.   [facility] [address]  Initial call taken by: Doristine Devoid CMA,  June 10, 2010 4:50 PM  Follow-up for Phone Call        see Rx Follow-up by: Marga Melnick MD,  June 10, 2010 4:56 PM  Additional Follow-up for Phone Call Additional follow up Details #1::        left msg on voicemail prescription sent to pharmacy .Marland KitchenMarland KitchenMarland KitchenDoristine Devoid CMA  June 10, 2010 5:06 PM     New/Updated Medications: CYCLOBENZAPRINE HCL 5 MG TABS (CYCLOBENZAPRINE HCL) 1 two times a day & 2 as needed Prescriptions: CYCLOBENZAPRINE HCL 5 MG TABS (CYCLOBENZAPRINE HCL) 1 two times a day & 2 as needed  #20 x 0   Entered by:   Doristine Devoid CMA   Authorized by:   Marga Melnick MD   Signed by:   Doristine Devoid CMA on 06/10/2010   Method used:   Electronically to        [facility]* (retail)       [phone]       [place], [place]  [address]       Ph: [phone]       Fax: [phone]   RxID:   [id number]

## 2010-06-23 NOTE — Progress Notes (Signed)
Summary: med clarification  Phone Note Call from Patient   Caller: Patient Summary of Call: Pt called and stated that she went to pick up her Flexeril. Pharm would not give her the med because the directions were not clear. Please clarify rx and send to [facility] on [address].  Initial call taken by: Army Fossa CMA,  June 13, 2010 3:51 PM  Follow-up for Phone Call        Dr.Hopper please advise on instruction for Flexeril, pharmacy indicates they are not clear. Would it be better to put 1 by mouth four times daily vs how it is currently written  Follow-up by: Shonna Chock CMA,  June 13, 2010 4:05 PM  Additional Follow-up for Phone Call Additional follow up Details #1::         the prescription should read : one pill every 6-8 hours as needed and 1-2 at bedtime as needed. Daily dose should not exceed 4 pills. Additional Follow-up by: Marga Melnick MD,  June 13, 2010 5:02 PM    Additional Follow-up for Phone Call Additional follow up Details #2::    Change made to rx instruction, left message informing patient rx sent in with corrected instructions./[facility] CMA  June 13, 2010 5:04 PM   New/Updated Medications: CYCLOBENZAPRINE HCL 5 MG TABS (CYCLOBENZAPRINE HCL) one pill every 6-8 hours as needed and 1-2 at bedtime as needed. Daily dose should not exceed 4 pills. Prescriptions: CYCLOBENZAPRINE HCL 5 MG TABS (CYCLOBENZAPRINE HCL) one pill every 6-8 hours as needed and 1-2 at bedtime as needed. Daily dose should not exceed 4 pills.  #20 x 0   Entered by:   Shonna Chock CMA   Authorized by:   Marga Melnick MD   Signed by:   Shonna Chock CMA on 06/13/2010   Method used:   Electronically to        [facility]* (retail)       [phone]       [place], [place]  [address]       Ph: [phone]       Fax: [phone]   RxID:   [id number]

## 2010-08-06 ENCOUNTER — Other Ambulatory Visit: Payer: Self-pay | Admitting: Internal Medicine

## 2010-08-09 NOTE — Assessment & Plan Note (Signed)
[facility]                                 ON-CALL NOTE   CAMELA, WICH                         MRN:          [id number]  DATE:08/10/2008                            DOB:          11-23-1960    Phone number is [phone].   CHIEF COMPLAINT:  Sore throat and sinus symptoms.   She says she went to see Dr. Alwyn Ren today.  She was diagnosed with  allergies, sore throat, and possibly a sinus infection.  She was given  Augmentin 875 mg and told to take Zyrtec.  She has not had her first  dose of Zyrtec yet and she is noting tonight her throat is much more  sore.  It hurts to talk and she is a bit horse.  She does not have any  trouble swallowing or difficulty taking fluid; however, she denies  fever, chills, nausea, vomiting, or other symptoms.  I advised her to go  ahead and continue the Augmentin and that if her sore throat worsens  tonight to the point where she cannot swallow or has trouble handling  her own saliva that she needs to go the emergency room or if she  develops fever, severe headache, or other symptoms.  Otherwise, to go  ahead and take Zyrtec for her nasal symptoms.  She can try Tylenol over  the counter for throat pain and call the office tomorrow if she is not  starting to improve or if she worsens.     Marne A. Tower, MD  Electronically Signed    MAT/MedQ  DD: 08/10/2008  DT: 08/11/2008  Job #: [id number]

## 2010-08-10 ENCOUNTER — Other Ambulatory Visit: Payer: Self-pay | Admitting: Internal Medicine

## 2010-08-11 NOTE — Telephone Encounter (Signed)
Called in, just in case rx did not go though on 08/06/10
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
## 2010-08-12 NOTE — Discharge Summary (Signed)
[name], [name]                ACCOUNT NO.:  [id number]   MEDICAL RECORD NO.:  [id number]          PATIENT TYPE:  INP   LOCATION:  5713                         FACILITY:  [facility]   PHYSICIAN:  Rene Paci, M.D. LHCDATE OF BIRTH:  [birth year]   DATE OF ADMISSION:  07/12/2005  DATE OF DISCHARGE:  07/18/2005                                 DISCHARGE SUMMARY   DISCHARGE DIAGNOSES:  1.  Nausea, vomiting, abdominal pain secondary to partial small bowel      obstruction, likely adhesions, resolved, tolerating p.o. without      problems or pain.  2.  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  Outpatient follow-up      with repeat LFTs in six weeks and Dr. Arlyce Dice, GI if still elevated.  3.  Incidental right breast nodule noted on MRI of abdomen for outpatient      diagnostic mammogram and appropriate follow-up if needed.  4.  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  See above.   DISCHARGE MEDICATIONS:  Ambien 5 mg one p.o. q.h.s. p.r.n.  No other usual  medications.   DISPOSITION:  Patient is discharged home.  Hospital follow-up is with her  primary M.D. in the next 48 hours; Dr. Laury Axon for Thursday, April 26 at 3:15  p.m. to review overall clinical status as well as arrange diagnostic  mammogram and further evaluation.  See MRI report from April 23 for more  details done at [facility].   HOSPITAL COURSE:  #1 - PARTIAL SMALL BOWEL OBSTRUCTION:  Patient is a  pleasant 50 year old woman admitted April 18 with abdominal pain, nausea,  and vomiting over the three days prior.  Abdominal films including a CT of  the abdomen and pelvis were shown to have small bowel obstruction on April  18, but no evidence of cholecystitis to explain elevated LFTs.  As such, a  surgery consultation was obtained and G tube for decompression also helped  to alleviate patient's symptoms and she was seen by GI service, Dr.  Arlyce Dice  also regarding these above findings.  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  LFTs improved and were likely thought to be secondary to medications and/or  Gilbert's disease not requiring further follow-up, but recommend repeat LFT  check in six weeks and follow-up GI if remaining elevated.  After several  days of NG decompressions patient's pain resolved and she was ready to  tolerate p.o.  Her films __________ show complete resolution of the partial  obstruction, though clinically she did well with liquids and advanced to a  low residue diet without difficulty.  She has had no further nausea and  vomiting in the 48-72 hours since removing the NG tube and she is felt  clinically ready for discharge home as she has required no anti-emetics or  pain medications.  No surgical intervention or GI procedures have been  undergone during this hospitalization.   #2 - INCIDENTAL RIGHT BREAST NODULE:  On the MRI of her abdomen done for  evaluation of liver lesions an incidental right breast nodule was noted.  Radiology has recommended diagnostic mammogram which patient is  aware of and will discuss arranging with her primary M.D. later this week.  She is given a small prescription of Ambien associated for insomnia that she  has had during this hospitalization but no other medications have been  prescribed as she takes no medications regularly.      Rene Paci, M.D. [facility]  Electronically Signed     VL/MEDQ  D:  07/18/2005  T:  07/19/2005  Job:  [id number]
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
## 2010-08-12 NOTE — H&P (Signed)
[name]                ACCOUNT NO.:  [id number]   MEDICAL RECORD NO.:  [id number]          PATIENT TYPE:  INP   LOCATION:  5705                         FACILITY:  [facility]   PHYSICIAN:  Titus Dubin. Alwyn Ren, M.D. [facility] OF BIRTH:  December 28, 1960   DATE OF ADMISSION:  07/12/2005  DATE OF DISCHARGE:                                HISTORY & PHYSICAL   [name] is a 50 year old African-American female admitted with  abdominal pain.   She began to have nausea and vomiting on Sunday, April 15. She has vomiting  8 to 10 times since onset of the illness. She has had some chills and  sweats; she is unsure whether she has had any significant fever. The pain  has been epigastric and sharp. She denies any rectal bleeding or melena. As  of April 17, she had some hematuria and dysuria. She has had no  cardiopulmonary symptoms, and review of systems is otherwise negative.   She was seen at urgent care on April 15 and treated with Cipro and  promethazine and Phenergan intramuscularly. She saw Dr. Laury Axon on April 17  because she was unable to keep the oral medicines done due to vomiting. Her  intake had been essentially only Gatorade. She was given promethazine IM,  and labs and ultrasound scheduled.   She called April 18 stating that she was still vomiting, and the vomitus had  some blood. She was told to come to the office where she was evaluated and  admission arranged.   PAST MEDICAL HISTORY:  Hysterectomy and unilateral salpingo-oophorectomy for  fibroids. She had had dysmenorrhea to the point that she required  transfusions in January of 2003 prior to the hysterectomy. In 1997,  colonoscopy revealed a polyp. In 2005, colonoscopy was negative.   FAMILY HISTORY:  Paternal uncle had diabetes and died in his 22s. Father had  colon cancer as did maternal great aunt. Father also had renal and hepatic  disease.   MEDICATIONS:  [name] is on no medications regularly.   ALLERGIES:  She  has no known drug allergies.   SOCIAL HISTORY:  She does not smoke or drink. She formally admits to heavy  alcohol intake but none since November 2006. She has been involved in [place].   PHYSICAL EXAMINATION:  GENERAL:  On exam, she is obviously uncomfortable  lying on exam table with her knees flexed in a coachman's position.  VITAL SIGNS:  Temperature was 99.1, pulse 76, blood pressure 120/90 and  respiratory rate 25.  HEENT:  She has no sclerae icterus or jaundice. Fundal exam is unremarkable.  Otolaryngological exam reveals no significant findings.  SKIN:  Warm and dry.  LYMPH NODES:  She had no lymphadenopathy about the head, neck or axilla.  CHEST:  A S4 was noted. Chest was clear.  ABDOMEN:  Distended and tender throughout. She had rare bowel sounds;  clinically, an ileus was present.  EXTREMITIES:  Pedal pulses were intact. She had no edema. There were no  localized or neuropsychiatric deficits.   LABORATORY DATA:  Labs were obtained from [facility]. Her white blood  count  was 17,100, hematocrit 49.9. Glucose 122, BUN 30 and total bilirubin 1.9.  Total protein was 9.0 with normal less than 8.3. Lipase and amylase were  within normal limits. Ultrasound of the abdomen revealed benign hemangiomas  of the liver (2) and sludge in the gallbladder but no gallstones or wall  thickening. The common bile duct had normal caliber.   She is now admitted with abdominal pain and nausea and vomiting. She will be  placed on IV fluids and IV medications. NG tube will be inserted. GI will be  consulted.      Titus Dubin. Alwyn Ren, M.D. [facility]  Electronically Signed     [facility]  D:  07/13/2005  T:  07/13/2005  Job:  [id number]   cc:   Loreen Freud, M.D.  [phone]. [address]  [place] [address]   Barbette Hair. Arlyce Dice, M.D. [facility]  [address]  [place]  [place] [address]

## 2010-11-03 ENCOUNTER — Encounter: Payer: Self-pay | Admitting: Internal Medicine

## 2010-11-19 ENCOUNTER — Other Ambulatory Visit: Payer: Self-pay | Admitting: Internal Medicine

## 2010-12-14 LAB — POCT URINALYSIS DIP (DEVICE)
Hgb urine dipstick: NEGATIVE
Ketones, ur: NEGATIVE
Protein, ur: NEGATIVE
Specific Gravity, Urine: 1.02
pH: 5.5

## 2011-02-06 ENCOUNTER — Encounter: Payer: Self-pay | Admitting: Family

## 2011-02-06 ENCOUNTER — Ambulatory Visit (INDEPENDENT_AMBULATORY_CARE_PROVIDER_SITE_OTHER): Payer: BC Managed Care – PPO | Admitting: Family

## 2011-02-06 VITALS — BP 114/70 | HR 78 | Temp 97.8°F | Resp 16 | Wt 167.1 lb

## 2011-02-06 DIAGNOSIS — M545 Low back pain: Secondary | ICD-10-CM | POA: Insufficient documentation

## 2011-02-06 DIAGNOSIS — R079 Chest pain, unspecified: Secondary | ICD-10-CM

## 2011-02-06 DIAGNOSIS — Z23 Encounter for immunization: Secondary | ICD-10-CM

## 2011-02-06 MED ORDER — MELOXICAM 7.5 MG PO TABS: 7.5000 mg | ORAL_TABLET | Freq: Every day | ORAL | Status: DC

## 2011-02-06 NOTE — Assessment & Plan Note (Signed)
50 yr old female with low back pain, some radiation into the right thigh.  We discussed obtaining MRI and Physical therapy. She wishes to hold off on these options for now in favor of ongoing conservative treatment.  Will rx mobic daily PRN and she is to continue flexeril prn.  If symptoms worsen, or if no improvement in 2 weeks she will let us know.

## 2011-02-06 NOTE — Patient Instructions (Addendum)
Call if your symptoms worsen or if they are not improved in 2 weeks.

## 2011-02-06 NOTE — Progress Notes (Signed)
Subjective:    Patient ID: [name], female    DOB: 1960-03-30, 50 y.o.   MRN: [id number]  HPI  Ms.  Lucas is a 50 yr old female who presents today with chief complaint of low back pain.  She reports that pain started a few weeks ago.  Noted intermittent sharp back pain.  Pain is aching in nature and radiates to the right groin and the right thigh.  Pain is made worse by moving, sitting. Pain is improved by ice and heat and certain positions.  She has flexeril and ibuprofen 800mg  which was prescibed by [facility] on 11/7.          Review of Systems Past Medical History  Diagnosis Date  . Anemia     NOS  . Allergy     ALSO DRY EYES  . Hyperlipidemia   . History of colonic polyps   . Hyperglycemia     History   Social History  . Marital Status: Single    Spouse Name: N/A    Number of Children: N/A  . Years of Education: N/A   Occupational History  . teacher    Social History Main Topics  . Smoking status: Former Smoker    Quit date: 03/27/2001  . Smokeless tobacco: Not on file  . Alcohol Use: No  . Drug Use:   . Sexually Active:    Other Topics Concern  . Not on file   Social History Narrative   Gets regular exercise    Past Surgical History  Procedure Date  . Abdominal hysterectomy     &USO for fibroid & servere anemia  . Fetal blood transfusion 03/2001    pre tah  . Tonsillectomy   . Partial colectomy 1997    for polyps    Family History  Problem Relation Age of Onset  . Cancer Father     colon...leukemia  . Hyperlipidemia Brother   . Cancer Maternal Aunt     colon  . Diabetes Paternal Uncle   . Asthma Maternal Grandmother     Allergies  Allergen Reactions  . Codeine     REACTION: GI UPSET    Current Outpatient Prescriptions on File Prior to Visit  Medication Sig Dispense Refill  . cyclobenzaprine (FLEXERIL) 5 MG tablet TAKE ONE TABLET BY MOUTH EVERY 6 TO 8 HOURS AS NEEDED AND ONE TO TWO TABLETS AT BEDTIME.  DAILY DOSE SHOULD  NOT EXCEED 4 TABLETS  20 tablet  0  . Diphenhydramine-APAP, sleep, (TYLENOL PM EXTRA STRENGTH PO) Take by mouth as needed.        Marland Kitchen oxyCODONE-acetaminophen (PERCOCET) 7.5-325 MG per tablet Take 1 tablet by mouth every 6 (six) hours as needed.        . Sodium Chloride-Sodium Bicarb (GNP SINUS WASH NETI POT) 2300-700 MG KIT Place into the nose as needed.        . Acetaminophen-Codeine (TYLENOL/CODEINE #3) 300-30 MG per tablet Take 1 tablet by mouth every 4 (four) hours as needed.        . Cetirizine HCl (ZYRTEC ALLERGY PO) Take by mouth as needed.        . citalopram (CELEXA) 20 MG tablet TAKE ONE TABLET BY MOUTH EVERY DAY  30 tablet  4  . Estradiol (DIVIGEL) 0.25 MG/0.25GM GEL Place onto the skin. RUB ON UPPER THIGH ONCE DAILY       . fluticasone (FLONASE) 50 MCG/ACT nasal spray Place 2 sprays into the nose daily.  BP 114/70  Pulse 78  Temp(Src) 97.8 F (36.6 C) (Oral)  Resp 16  Wt 167 lb 1.3 oz (75.787 kg)  LMP 03/27/2001       Objective:   Physical Exam  Constitutional: She is oriented to person, place, and time. She appears well-developed and well-nourished.  Cardiovascular: Normal rate and regular rhythm.   No murmur heard. Pulmonary/Chest: Effort normal and breath sounds normal. No respiratory distress. She has no wheezes. She has no rales. She exhibits no tenderness.  Musculoskeletal: She exhibits no edema.       Bilateral LE strength is 5/5 Pt is able to toe walk and heel walk without difficulty.  Neurological: She is alert and oriented to person, place, and time.  Reflex Scores:      Patellar reflexes are 1+ on the right side and 1+ on the left side.      Achilles reflexes are 1+ on the right side and 1+ on the left side. Skin: Skin is warm and dry.  Psychiatric: She has a normal mood and affect. Her behavior is normal. Judgment and thought content normal.          Assessment & Plan:

## 2011-02-19 IMAGING — CT CT PARANASAL SINUSES LIMITED
1 of 2 series · 16 of 28 positions shown, 20 images · non-contrast
Comparison: None available.

CLINICAL DATA: Frequent sinus infections.

CT PARANASAL SINUS LIMITED WITHOUT CONTRAST
TECHNIQUE: Multidetector CT images of the paranasal sinuses were
obtained in a single plane without contrast.

[Series 4: ltd sinus 3.0 h30s · axial · 0.31mm/px · z∈[-117,-5]mm · 16 of 27 slices shown, 20 images]
[im 2/27  brain]
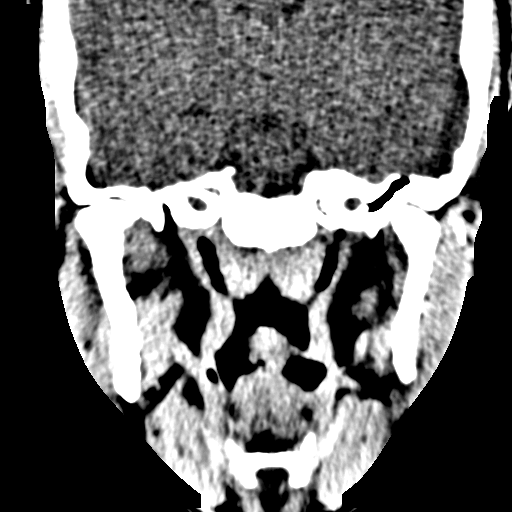
[im 2/27  bone]
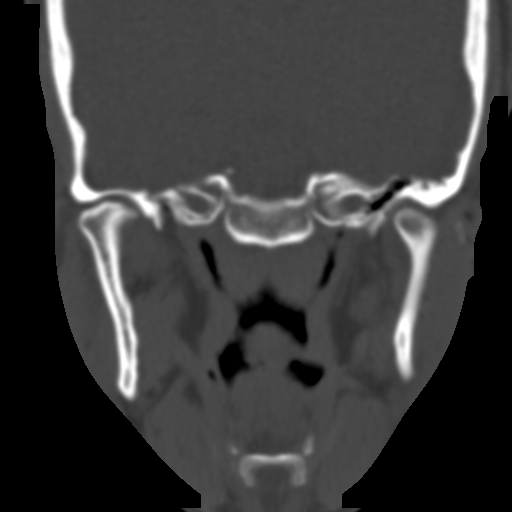
[im 4/27  bone]
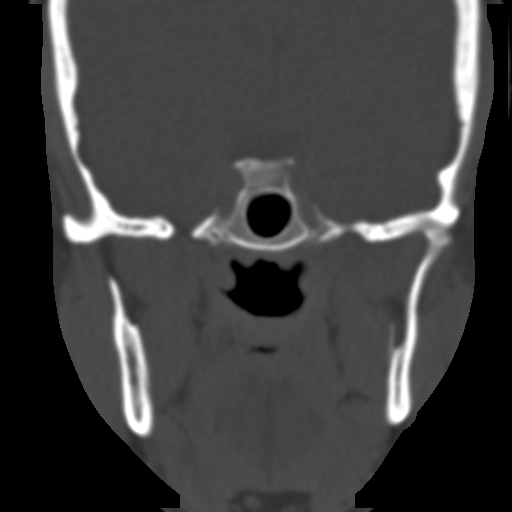
[im 5/27  bone]
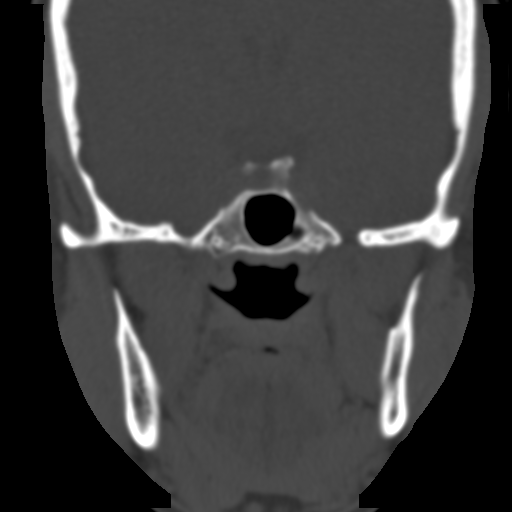
[im 7/27  bone]
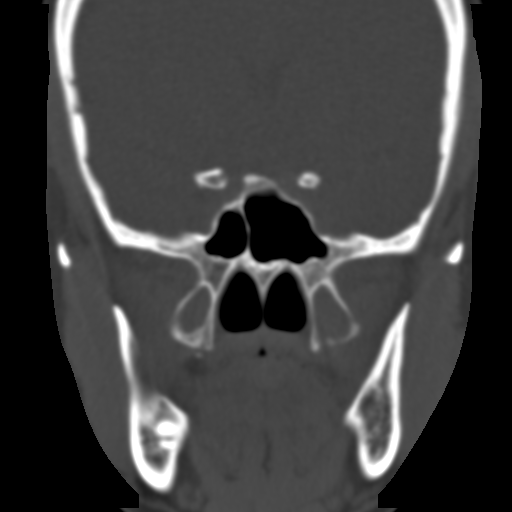
[im 9/27  brain]
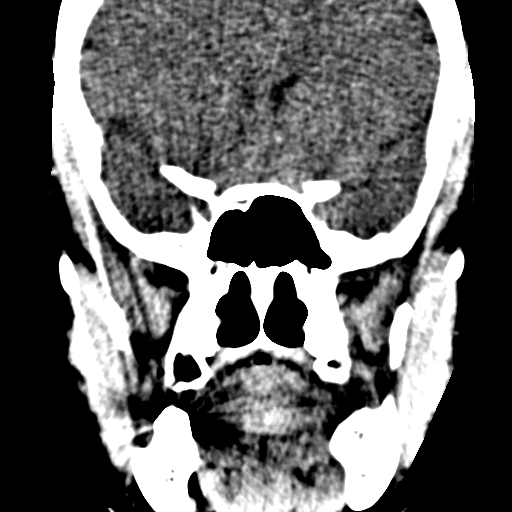
[im 9/27  bone]
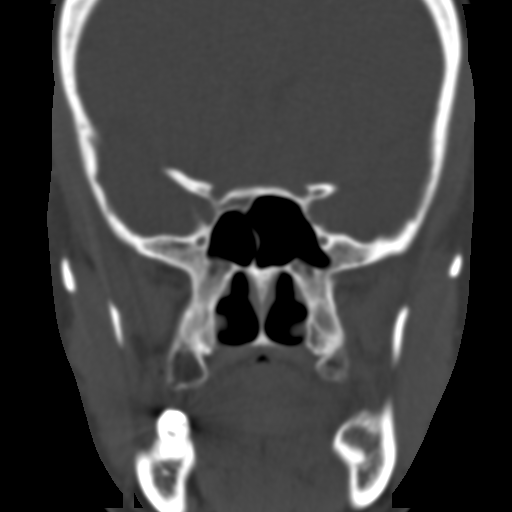
[im 10/27  bone]
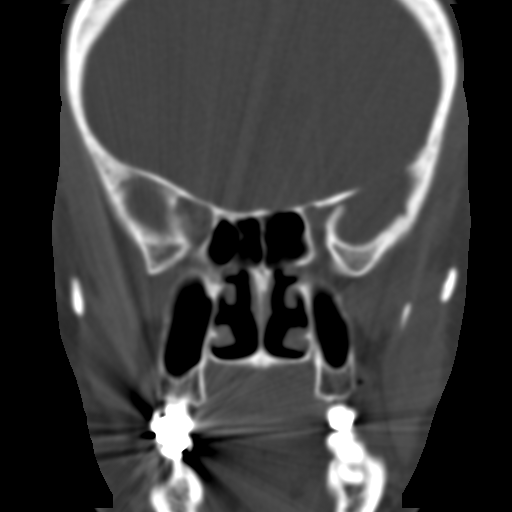
[im 12/27  bone]
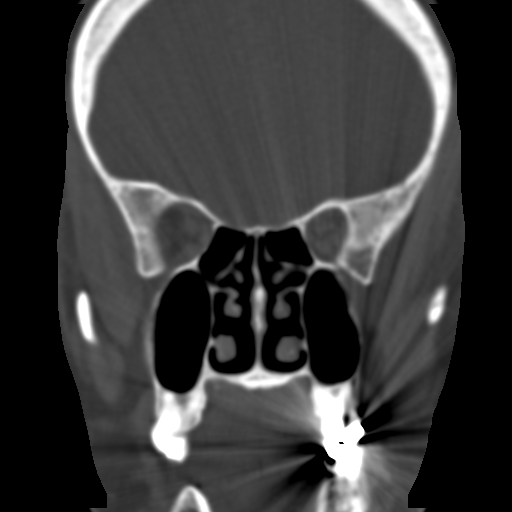
[im 13/27  bone]
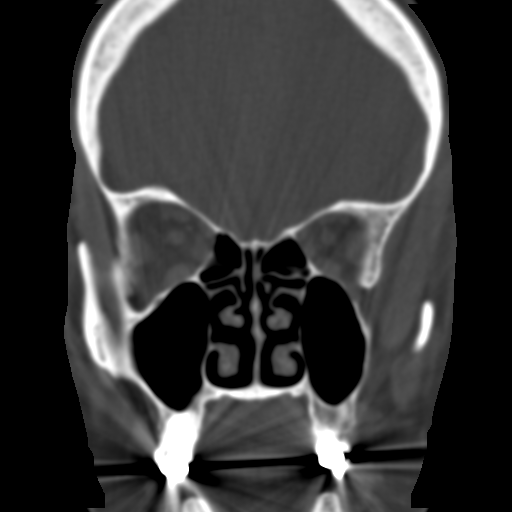
[im 15/27  brain]
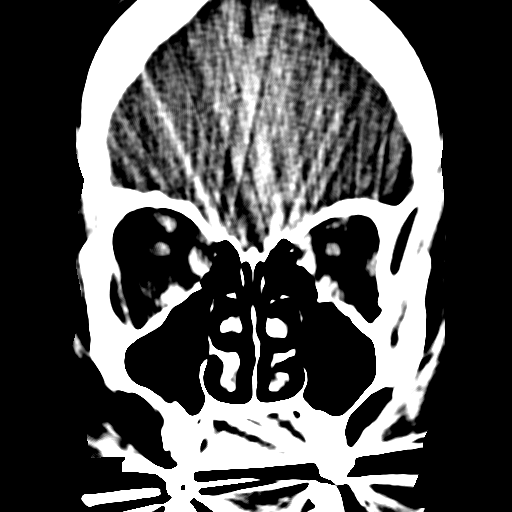
[im 15/27  bone]
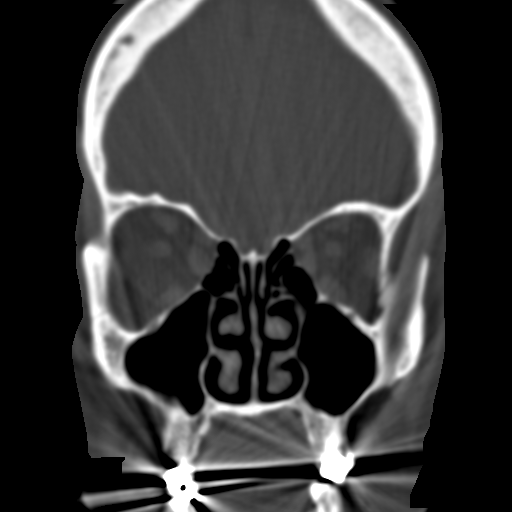
[im 16/27  bone]
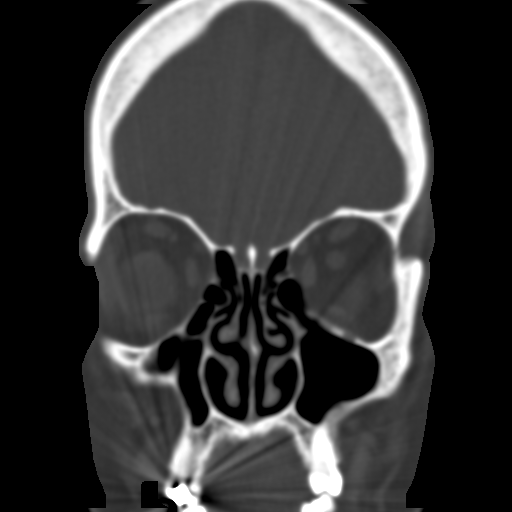
[im 18/27  bone]
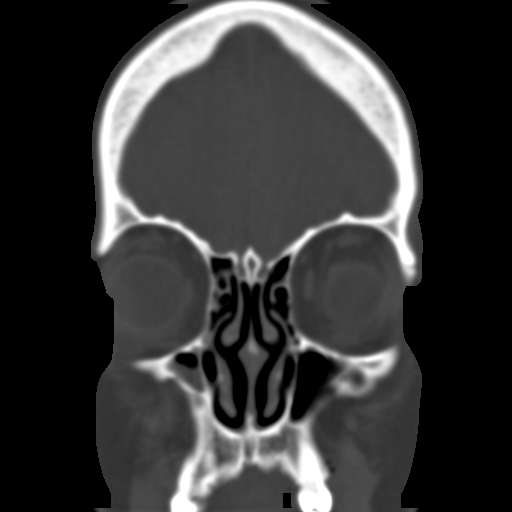
[im 19/27  bone]
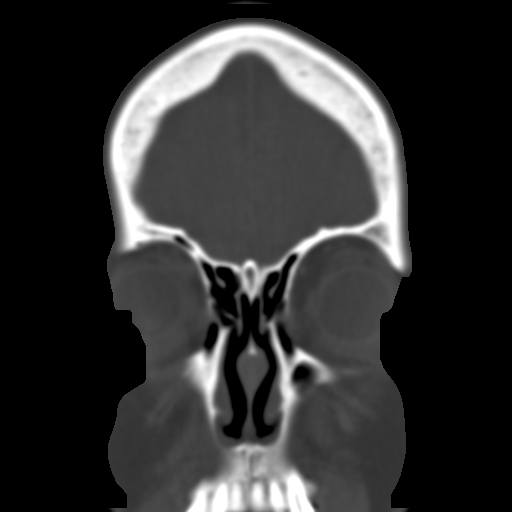
[im 21/27  brain]
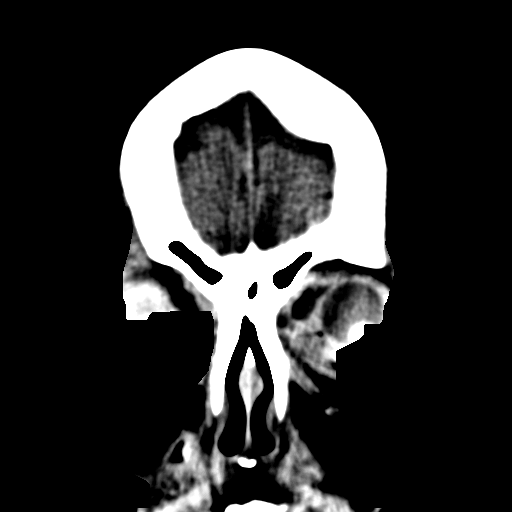
[im 21/27  bone]
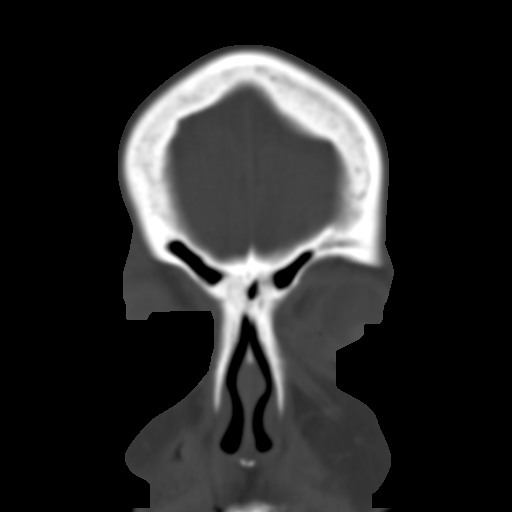
[im 23/27  bone]
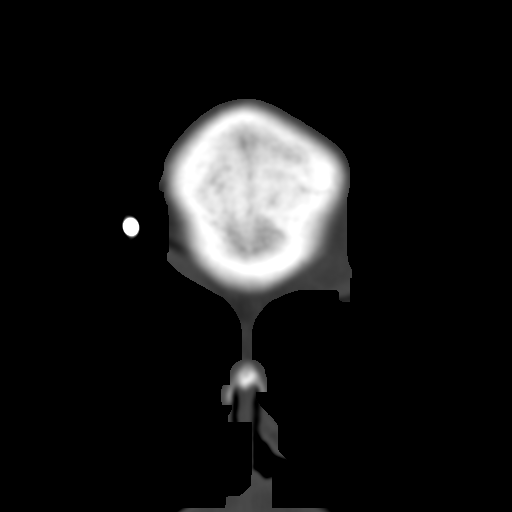
[im 24/27  bone]
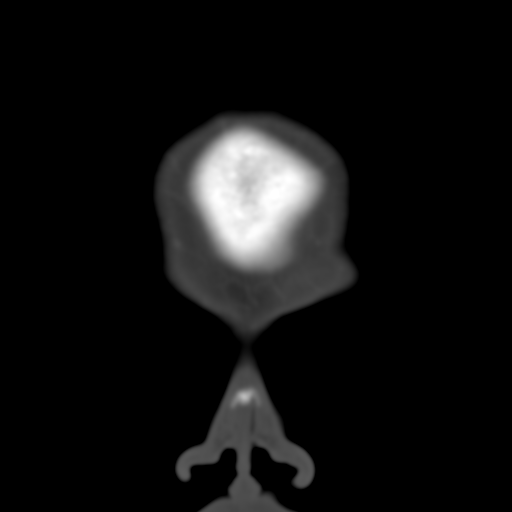
[im 26/27  bone]
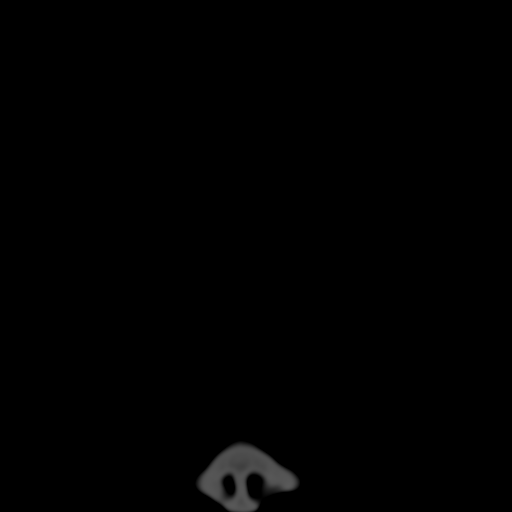

[16 of 28 positions shown; findings below may reference images not displayed]

FINDINGS: There is mucosal picking anteriorly in the right
maxillary sinus.  Paranasal sinuses otherwise appear clear.  Some
mucoid density material opacifies the right ostiomeatal complex.
Left ostiomeatal complex is widely patent.  Imaged intracranial
contents appear normal.
IMPRESSION: 1.  Mild mucosal thickening anteriorly in the right maxillary
sinus.  Paranasal sinuses otherwise clear.
2.  Mucoid density material appears to opacify the right
ostiomeatal complex.

## 2011-02-23 ENCOUNTER — Encounter: Payer: Self-pay | Admitting: Internal Medicine

## 2011-02-23 ENCOUNTER — Ambulatory Visit (INDEPENDENT_AMBULATORY_CARE_PROVIDER_SITE_OTHER): Payer: BC Managed Care – PPO | Admitting: Internal Medicine

## 2011-02-23 VITALS — BP 114/78 | HR 78 | Temp 98.4°F | Wt 166.6 lb

## 2011-02-23 DIAGNOSIS — Z86010 Personal history of colonic polyps: Secondary | ICD-10-CM

## 2011-02-23 DIAGNOSIS — M5416 Radiculopathy, lumbar region: Secondary | ICD-10-CM

## 2011-02-23 DIAGNOSIS — IMO0002 Reserved for concepts with insufficient information to code with codable children: Secondary | ICD-10-CM

## 2011-02-23 MED ORDER — GABAPENTIN 100 MG PO CAPS: ORAL_CAPSULE | ORAL | Status: DC

## 2011-02-23 NOTE — Patient Instructions (Signed)
The best exercises for the low back include freestyle swimming, stretch aerobics, and yoga. 

## 2011-02-23 NOTE — Progress Notes (Signed)
Subjective:    Patient ID: [name], female    DOB: [birth year], 50 y.o.   MRN: [id number]  HPI BACK PAIN: Onset:mid October Trigger / injury:possibly related to  trail walking & after blowing nose Treatment / response: NSAIDS, heat , ice w/o benefit  Location: R LS area Radiation:to medial thigh to knee as 11/7  Quality: sharp, aching Evaluation/treatment: 11/7 [place] walk in clinic: pain shot, Motrin 800 , muscle relaxants w/o significant  [address] [facility] 11/12:Meloxicam w/o benefit    Trauma: no Best sitting/standing/leaning forward: slightly Worse: standing   Red Flags Fecal/urinary incontinence: no  Numbness/Weakness: no  Fever/chills/sweats: no  Unexplained weight loss: no  No relief with bedrest: no  h/o cancer/immunosuppression: partial colon resection 1997 for malignancy  PMH of osteoporosis or chronic steroid use: no       Review of Systems     Objective:   Physical Exam Gen.: Healthy and well-nourished in appearance. Alert, appropriate and cooperative throughout exam.  Neck: No deformities, masses, or tenderness noted. Range of motion normal. Thyroid ; physiologic asymmetry. Lungs: Normal respiratory effort; chest expands symmetrically. Lungs are clear to auscultation without rales, wheezes, or increased work of breathing. Heart: Normal rate and rhythm. Normal S1 and S2. No gallop, click, or rub. S 4 w/o murmur. Abdomen: Bowel sounds normal; abdomen soft and nontender. No masses, organomegaly or hernias noted. Dullness to percussion right upper quadrant                                                                                  Musculoskeletal/extremities: No deformity or scoliosis noted of  the thoracic or lumbar spine. No clubbing, cyanosis, edema, or deformity noted. Range of motion  normal .Tone & strength : Slight weakness to opposition suggested in the right thigh. She is able to lie back and set up without help. Leg raising is negative .Joints  normal. Nail health  good. Vascular: Carotid, radial artery, dorsalis pedis and  posterior tibial pulses are full and equal. No bruits present. Neurologic: Alert and oriented x3. Deep tendon reflexes symmetrical and 1/2 +. Heel and toe walking is normal         Skin: Intact without suspicious lesions or rashes. Lymph: No cervical, axillary lymphadenopathy present. Psych: Mood and affect are normal. Normally interactive                                                                                        Assessment & Plan:  #1 possible L3-4 radiculopathy; rule out degenerative disc disease.  #2 past medical history of malignant colon polyp; status post partial colon resection  Plan: #1 plain films of lumbosacral spine  #2 gabapentin titration as needed.  Comment: This pain has failed to respond to nonsteroidals, heat, cold, Cox 2 agents over a six-week period. She is  very well conditioned and stoic. If the plain films are negative and she fails to respond to gabapentin; MRI would be indicated.

## 2011-07-25 ENCOUNTER — Ambulatory Visit: Payer: BC Managed Care – PPO | Admitting: Family

## 2011-07-26 ENCOUNTER — Ambulatory Visit (INDEPENDENT_AMBULATORY_CARE_PROVIDER_SITE_OTHER): Payer: BC Managed Care – PPO | Admitting: Family

## 2011-07-26 ENCOUNTER — Encounter: Payer: Self-pay | Admitting: Family

## 2011-07-26 VITALS — BP 120/80 | HR 63 | Temp 97.8°F | Resp 16 | Wt 166.0 lb

## 2011-07-26 DIAGNOSIS — M25551 Pain in right hip: Secondary | ICD-10-CM

## 2011-07-26 DIAGNOSIS — M25559 Pain in unspecified hip: Secondary | ICD-10-CM

## 2011-07-26 MED ORDER — MELOXICAM 7.5 MG PO TABS: 7.5000 mg | ORAL_TABLET | Freq: Every day | ORAL | Status: DC

## 2011-07-26 NOTE — Progress Notes (Signed)
  Subjective:    Patient ID: [name], female    DOB: 1961/03/14, 51 y.o.   MRN: [id number]  HPI  Pt presents today with chief complaint of right hip pain.  Started 1 month ago.  She reports that pain is in the joint.  She denies injury to the right hip, but remains active with "trail walking."  Pain is worse in the morning.  Sometimes walking worsens the pain.  Tender to palpation.  She also has some associated low back pain with laying down.  Has tried an otc pain med, not sure of name.      Review of Systems See HPI  Past Medical History  Diagnosis Date  . Anemia     NOS  . Allergy     ALSO DRY EYES  . Hyperlipidemia   . History of colonic polyps   . Hyperglycemia     History   Social History  . Marital Status: Single    Spouse Name: N/A    Number of Children: N/A  . Years of Education: N/A   Occupational History  . teacher    Social History Main Topics  . Smoking status: Former Smoker    Quit date: 03/27/2001  . Smokeless tobacco: Not on file  . Alcohol Use: No  . Drug Use:   . Sexually Active:    Other Topics Concern  . Not on file   Social History Narrative   Gets regular exercise    Past Surgical History  Procedure Date  . Abdominal hysterectomy     &USO for fibroid & servere anemia  . Fetal blood transfusion 03/2001    pre tah  . Tonsillectomy   . Partial colectomy 1997    for "precancerous "polyps , Dr Carollee Massed, [place]    Family History  Problem Relation Age of Onset  . Cancer Father     colon...leukemia  . Hyperlipidemia Brother   . Cancer Maternal Aunt     colon  . Diabetes Paternal Uncle   . Asthma Maternal Grandmother     Allergies  Allergen Reactions  . Codeine     REACTION: GI UPSET    Current Outpatient Prescriptions on File Prior to Visit  Medication Sig Dispense Refill  . diphenhydrAMINE (BENADRYL) 25 MG tablet Take 25 mg by mouth as needed.      Marland Kitchen ibuprofen (ADVIL,MOTRIN) 800 MG tablet Take 800 mg by mouth every  8 (eight) hours as needed.        . cyclobenzaprine (FLEXERIL) 5 MG tablet TAKE ONE TABLET BY MOUTH EVERY 6 TO 8 HOURS AS NEEDED AND ONE TO TWO TABLETS AT BEDTIME.  DAILY DOSE SHOULD NOT EXCEED 4 TABLETS  20 tablet  0    BP 120/80  Pulse 63  Temp(Src) 97.8 F (36.6 C) (Oral)  Resp 16  Wt 166 lb (75.297 kg)  SpO2 99%  LMP 03/27/2001       Objective:   Physical Exam  Constitutional: She appears well-developed and well-nourished. No distress.  Cardiovascular: Normal rate and regular rhythm.   No murmur heard. Pulmonary/Chest: Effort normal and breath sounds normal. No respiratory distress. She has no wheezes. She has no rales. She exhibits no tenderness.  Musculoskeletal:       Mild pain with abduction/adduction of the right hip.  Mild tenderness to palpation of the right hip without tenderness.          Assessment & Plan:

## 2011-07-26 NOTE — Patient Instructions (Addendum)
Please call if hip pain worsens or if no improvement in 2 weeks.

## 2011-07-31 ENCOUNTER — Telehealth: Payer: Self-pay | Admitting: Family

## 2011-07-31 DIAGNOSIS — M25559 Pain in unspecified hip: Secondary | ICD-10-CM | POA: Insufficient documentation

## 2011-07-31 NOTE — Telephone Encounter (Signed)
She can try otc aleve 220mg  bid instead.

## 2011-07-31 NOTE — Telephone Encounter (Signed)
Left message requesting that pt call us back.  When she calls back please make sure that she knows not to take ibuprofen and meloxicam together.   The meloxicam will replace the ibuprofen.

## 2011-07-31 NOTE — Telephone Encounter (Signed)
Notified pt. She reports that she has taken 3 doses of Meloxicam and has experienced nausea. States she has taken it with food each time. Wants to know if there is another alternative?  Please advise.

## 2011-07-31 NOTE — Assessment & Plan Note (Signed)
Suspect mild OA vs bursitis.  Recommended short course of NSAIDS.  Consider further imaging if symptoms worsen, or if no improvement in next few weeks.  Pt verbalizes understanding.

## 2011-08-01 NOTE — Telephone Encounter (Signed)
Left detailed message on pt's cell# and to call if any questions. 

## 2011-10-06 ENCOUNTER — Ambulatory Visit: Payer: BC Managed Care – PPO | Admitting: Internal Medicine

## 2011-12-25 ENCOUNTER — Encounter: Payer: Self-pay | Admitting: Family

## 2011-12-25 ENCOUNTER — Ambulatory Visit (HOSPITAL_BASED_OUTPATIENT_CLINIC_OR_DEPARTMENT_OTHER)
Admission: RE | Admit: 2011-12-25 | Discharge: 2011-12-25 | Disposition: A | Discharge status: Home / Self Care | Payer: BC Managed Care – PPO | Source: Ambulatory Visit | Attending: Family | Admitting: Family

## 2011-12-25 ENCOUNTER — Ambulatory Visit (INDEPENDENT_AMBULATORY_CARE_PROVIDER_SITE_OTHER): Payer: BC Managed Care – PPO | Admitting: Family

## 2011-12-25 VITALS — BP 102/80 | HR 77 | Temp 98.3°F | Resp 16 | Wt 163.0 lb

## 2011-12-25 DIAGNOSIS — M545 Low back pain, unspecified: Secondary | ICD-10-CM | POA: Insufficient documentation

## 2011-12-25 DIAGNOSIS — M549 Dorsalgia, unspecified: Secondary | ICD-10-CM

## 2011-12-25 DIAGNOSIS — H1045 Other chronic allergic conjunctivitis: Secondary | ICD-10-CM

## 2011-12-25 DIAGNOSIS — R1310 Dysphagia, unspecified: Secondary | ICD-10-CM

## 2011-12-25 DIAGNOSIS — M25559 Pain in unspecified hip: Secondary | ICD-10-CM | POA: Insufficient documentation

## 2011-12-25 DIAGNOSIS — H1010 Acute atopic conjunctivitis, unspecified eye: Secondary | ICD-10-CM | POA: Insufficient documentation

## 2011-12-25 DIAGNOSIS — Z23 Encounter for immunization: Secondary | ICD-10-CM

## 2011-12-25 MED ORDER — MELOXICAM 7.5 MG PO TABS: 7.5000 mg | ORAL_TABLET | Freq: Every day | ORAL | Status: DC

## 2011-12-25 MED ORDER — OLOPATADINE HCL 0.1 % OP SOLN: 1.0000 [drp] | Freq: Two times a day (BID) | OPHTHALMIC | Status: DC

## 2011-12-25 MED ORDER — OMEPRAZOLE 40 MG PO CPDR: 40.0000 mg | DELAYED_RELEASE_CAPSULE | Freq: Every day | ORAL | Status: DC

## 2011-12-25 NOTE — Patient Instructions (Addendum)
Please complete your x-rays on the first floor. You will be contact about your referral to gastroenterology.  Please let us know if you have not heard back within 1 week about your referral. Call if symptoms worsen or if no improvement with meloxicam.

## 2011-12-25 NOTE — Assessment & Plan Note (Signed)
Trial of patanol.  

## 2011-12-25 NOTE — Assessment & Plan Note (Signed)
Obtain Hip x-ray.  Trial of mobic.

## 2011-12-25 NOTE — Progress Notes (Signed)
Subjective:    Patient ID: [name], female    DOB: 01-18-1961, 51 y.o.   MRN: [id number]  HPI  [name] is a 51 yr old female who presents today with 2 concerns:  1) Back pain-Pain worse with sitting.  Dull ache, moderate to intense.  Sometimes radiates down the left buttock.  Present x 6 months. Trying otc pain med- unsure of name. No significant relief.  2) Dysphagia-Feels like food gets caught.  Some help with otc prilosec.  Did 14 day course.    3) Eye irritation- R eye is red. Both eyes were itching last night.  Denies discharge from eyes.   Review of Systems    see HPI  Past Medical History  Diagnosis Date  . Anemia     NOS  . Allergy     ALSO DRY EYES  . Hyperlipidemia   . History of colonic polyps   . Hyperglycemia     History   Social History  . Marital Status: Single    Spouse Name: N/A    Number of Children: N/A  . Years of Education: N/A   Occupational History  . teacher    Social History Main Topics  . Smoking status: Former Smoker    Quit date: 03/27/2001  . Smokeless tobacco: Not on file  . Alcohol Use: No  . Drug Use:   . Sexually Active:    Other Topics Concern  . Not on file   Social History Narrative   Gets regular exercise    Past Surgical History  Procedure Date  . Abdominal hysterectomy     &USO for fibroid & servere anemia  . Fetal blood transfusion 03/2001    pre tah  . Tonsillectomy   . Partial colectomy 1997    for "precancerous "polyps , Dr Carollee Massed, [place]    Family History  Problem Relation Age of Onset  . Cancer Father     colon...leukemia  . Hyperlipidemia Brother   . Cancer Maternal Aunt     colon  . Diabetes Paternal Uncle   . Asthma Maternal Grandmother     Allergies  Allergen Reactions  . Codeine     REACTION: GI UPSET    Current Outpatient Prescriptions on File Prior to Visit  Medication Sig Dispense Refill  . diphenhydrAMINE (BENADRYL) 25 MG tablet Take 25 mg by mouth as needed.        Marland Kitchen omeprazole (PRILOSEC) 40 MG capsule Take 1 capsule (40 mg total) by mouth daily.  30 capsule  2    BP 102/80  Pulse 77  Temp 98.3 F (36.8 C) (Oral)  Resp 16  Wt 163 lb (73.936 kg)  SpO2 97%  LMP 03/27/2001    Objective:   Physical Exam  Constitutional: She appears well-developed and well-nourished. No distress.  HENT:  Head: Normocephalic and atraumatic.  Eyes: Pupils are equal, round, and reactive to light. Right conjunctiva is injected. Left conjunctiva is injected.  Cardiovascular: Normal rate and regular rhythm.   No murmur heard. Pulmonary/Chest: Effort normal and breath sounds normal. No respiratory distress. She has no wheezes. She has no rales. She exhibits no tenderness.  Musculoskeletal: She exhibits no edema.       Thoracic back: She exhibits no tenderness.       Lumbar back: She exhibits no tenderness.  Neurological:  Reflex Scores:      Patellar reflexes are 1+ on the right side and 1+ on the left side.  Bilateral LE strength is 5/5  Skin: Skin is warm and dry.  Psychiatric: She has a normal mood and affect. Her behavior is normal. Judgment and thought content normal.          Assessment & Plan:

## 2011-12-25 NOTE — Assessment & Plan Note (Signed)
Trial of prescription dose PPI.  Refer to GI for further evaluation.

## 2011-12-25 NOTE — Assessment & Plan Note (Addendum)
Obtain plain film of lumbar spine.  Rx with meloxicam.

## 2011-12-26 ENCOUNTER — Encounter: Payer: Self-pay | Admitting: Internal Medicine

## 2012-01-29 ENCOUNTER — Ambulatory Visit: Payer: BC Managed Care – PPO | Admitting: Internal Medicine

## 2012-02-06 ENCOUNTER — Ambulatory Visit: Payer: BC Managed Care – PPO | Admitting: Internal Medicine

## 2012-07-31 ENCOUNTER — Encounter: Payer: Self-pay | Admitting: Family

## 2012-07-31 ENCOUNTER — Ambulatory Visit (INDEPENDENT_AMBULATORY_CARE_PROVIDER_SITE_OTHER): Payer: BC Managed Care – PPO | Admitting: Family

## 2012-07-31 VITALS — BP 110/80 | HR 62 | Temp 98.0°F | Resp 16 | Wt 166.1 lb

## 2012-07-31 DIAGNOSIS — Z6825 Body mass index (BMI) 25.0-25.9, adult: Secondary | ICD-10-CM

## 2012-07-31 DIAGNOSIS — M722 Plantar fascial fibromatosis: Secondary | ICD-10-CM | POA: Insufficient documentation

## 2012-07-31 DIAGNOSIS — E663 Overweight: Secondary | ICD-10-CM

## 2012-07-31 MED ORDER — MELOXICAM 7.5 MG PO TABS: 7.5000 mg | ORAL_TABLET | Freq: Every day | ORAL | Status: DC

## 2012-07-31 NOTE — Assessment & Plan Note (Signed)
We discussed importance of limiting calories to 1200-1500/day. Recommended trial of myfitnesspal app to help her count calories.

## 2012-07-31 NOTE — Assessment & Plan Note (Signed)
Symptoms most consistent with plantar fasciitis.  Recommended short course of meloxicam, ice foot bid, home exercises/pt ed provided to pt from [facility].

## 2012-07-31 NOTE — Progress Notes (Signed)
Subjective:    Patient ID: NIYONNA BETSILL, female    DOB: 01-08-1961, 52 y.o.   MRN: [id number]  HPI  Ms. Sans is a 52 yr old female who presents today with chief complaint of foot pain.  Pain is located in the left foot and has been present x 1 month.  Pain is worsening. Reports foot is tender to the touch.  She reports use of shoe inserts, ice, supportive shoes, OTC pain meds.  Reports that she hikes on the weekends and this pain is interfering with her hikes.   The pt also reports concerns re: difficulty losing weight and wants to know what she can do.  Review of Systems    see HPI  Past Medical History  Diagnosis Date  . Anemia     NOS  . Allergy     ALSO DRY EYES  . Hyperlipidemia   . History of colonic polyps   . Hyperglycemia   . 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 Sherene Sires  . Villous adenoma 1997    History   Social History  . Marital Status: Single    Spouse Name: N/A    Number of Children: N/A  . Years of Education: N/A   Occupational History  . teacher    Social History Main Topics  . Smoking status: Former Smoker    Quit date: 03/27/2001  . Smokeless tobacco: Not on file  . Alcohol Use: No  . Drug Use:   . Sexually Active:    Other Topics Concern  . Not on file   Social History Narrative   Gets regular exercise          Past Surgical History  Procedure Laterality Date  . Abdominal hysterectomy      &USO for fibroid & servere anemia  . Fetal blood transfusion  03/2001    pre tah  . Tonsillectomy    . Partial colectomy  1997    for "precancerous "polyps , Dr Carollee Massed, [facility]  . Colonoscopy      Family History  Problem Relation Age of Onset  . Colon cancer Father 55  . Hyperlipidemia Brother   . Colon cancer Maternal Aunt   . Diabetes Paternal Uncle   . Asthma Maternal Grandmother   . Leukemia Father     Allergies  Allergen Reactions  . Codeine     REACTION: GI UPSET    Current Outpatient Prescriptions on  File Prior to Visit  Medication Sig Dispense Refill  . diphenhydrAMINE (BENADRYL) 25 MG tablet Take 25 mg by mouth as needed.       No current facility-administered medications on file prior to visit.    BP 110/80  Pulse 62  Temp(Src) 98 F (36.7 C) (Oral)  Resp 16  Wt 166 lb 1.9 oz (75.352 kg)  BMI 28.08 kg/m2  SpO2 99%  LMP 03/27/2001    Objective:   Physical Exam  Constitutional: She is oriented to person, place, and time. She appears well-developed and well-nourished. No distress.  HENT:  Head: Normocephalic and atraumatic.  Cardiovascular: Normal rate and regular rhythm.   No murmur heard. Pulmonary/Chest: Effort normal and breath sounds normal. No respiratory distress. She has no wheezes. She has no rales. She exhibits no tenderness.  Musculoskeletal: She exhibits no edema.  Left foot- + tenderness to palpation overlying left arch  Neurological: She is alert and oriented to person, place, and time.          Assessment &  Plan:

## 2012-07-31 NOTE — Patient Instructions (Addendum)
Please ice foot twice daily, perform foot exericises, call if symptoms worsen or if not improved in 2-3 weeks.   Try counting calories and logging exercise on My Fitness Pal.

## 2012-08-02 ENCOUNTER — Ambulatory Visit: Payer: BC Managed Care – PPO | Admitting: Internal Medicine

## 2013-02-06 ENCOUNTER — Encounter: Payer: Self-pay | Admitting: Physician Assistant

## 2013-02-06 ENCOUNTER — Ambulatory Visit (INDEPENDENT_AMBULATORY_CARE_PROVIDER_SITE_OTHER): Payer: BC Managed Care – PPO | Admitting: Physician Assistant

## 2013-02-06 VITALS — Ht 64.5 in | Wt 164.8 lb

## 2013-02-06 DIAGNOSIS — S4980XA Other specified injuries of shoulder and upper arm, unspecified arm, initial encounter: Secondary | ICD-10-CM

## 2013-02-06 DIAGNOSIS — S46001A Unspecified injury of muscle(s) and tendon(s) of the rotator cuff of right shoulder, initial encounter: Secondary | ICD-10-CM

## 2013-02-06 MED ORDER — TRAMADOL HCL 50 MG PO TABS: 50.0000 mg | ORAL_TABLET | Freq: Three times a day (TID) | ORAL | Status: DC | PRN

## 2013-02-06 NOTE — Progress Notes (Signed)
Pre visit review using our clinic review tool, if applicable. No additional management support is needed unless otherwise documented below in the visit note. 

## 2013-02-06 NOTE — Patient Instructions (Signed)
Please take tramadol as prescribed (with food) for pain.  Avoid overhead motion of arm.  Alternate cold compresses and moist heat.  Apply topical salon pas or aspercreme to affected area.  Please stop by front desk to schedule appointment with Dr. Katrinka Blazing for possible steroid injection.

## 2013-02-07 DIAGNOSIS — S46009A Unspecified injury of muscle(s) and tendon(s) of the rotator cuff of unspecified shoulder, initial encounter: Secondary | ICD-10-CM | POA: Insufficient documentation

## 2013-02-07 NOTE — Progress Notes (Signed)
Patient ID: [name], female   DOB: Aug 15, 1960, 52 y.o.   MRN: [id number]  Patient presents to clinic today c/o intermittent right shoulder pain over the past week.  Was at its worst last night.  Pain is worsened with movement.  Patient denies trauma or injury.  Patient denies decrease in ROM but states overhead movements hurt.  Denies neck pain.  Denies elbow or wrist pain.  Denies numbness or tingling in hands. Has taken one Mobic with mild relief of symptoms.  Denies history of fracture or injury.  Past Medical History  Diagnosis Date  . Anemia     NOS  . Allergy     ALSO DRY EYES  . Hyperlipidemia   . History of colonic polyps   . Hyperglycemia   . 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 Sherene Sires  . Villous adenoma 1997    Current Outpatient Prescriptions on File Prior to Visit  Medication Sig Dispense Refill  . cetirizine (ZYRTEC) 10 MG tablet Take 10 mg by mouth daily as needed for allergies.      . diphenhydrAMINE (BENADRYL) 25 MG tablet Take 25 mg by mouth as needed.       No current facility-administered medications on file prior to visit.    Allergies  Allergen Reactions  . Codeine     REACTION: GI UPSET    Family History  Problem Relation Age of Onset  . Colon cancer Father 41  . Hyperlipidemia Brother   . Colon cancer Maternal Aunt   . Diabetes Paternal Uncle   . Asthma Maternal Grandmother   . Leukemia Father     History   Social History  . Marital Status: Single    Spouse Name: N/A    Number of Children: N/A  . Years of Education: N/A   Occupational History  . teacher    Social History Main Topics  . Smoking status: Former Smoker    Quit date: 03/27/2001  . Smokeless tobacco: None  . Alcohol Use: No  . Drug Use:   . Sexual Activity:    Other Topics Concern  . None   Social History Narrative   Gets regular exercise         ROS See HPI.  All other ROS are negative.  There were no vitals filed for this visit.  Physical  Exam  Vitals reviewed. Constitutional: She is oriented to person, place, and time and well-developed, well-nourished, and in no distress.  HENT:  Head: Normocephalic and atraumatic.  Eyes: Conjunctivae are normal.  Neck: Neck supple.  Cardiovascular: Normal rate, regular rhythm and normal heart sounds.   Pulmonary/Chest: Effort normal and breath sounds normal. No respiratory distress. She has no wheezes. She has no rales. She exhibits no tenderness.  Musculoskeletal:       Right shoulder: She exhibits decreased range of motion, tenderness and pain. She exhibits no bony tenderness, no swelling, no spasm and normal strength.       Left shoulder: Normal.       Right elbow: Normal.      Right wrist: Normal.  Neurological: She is alert and oriented to person, place, and time. She has normal sensation and normal strength.  Skin: Skin is warm and dry. No rash noted.  Psychiatric: Affect normal.   No results found for this or any previous visit (from the past 2160 hour(s)).  Assessment/Plan: Rotator cuff injury Patient encouraged to use antiinflammatories.  Patient states Mobic is not helping with pain.  Will Rx tramadol.  Alternate Ice and moist heat.  Topical Aspercreme.  Appointment scheduled with Antoine Primas for further evaluation and [place].  Discussed [place] versus other imaging modalities.  Patient is aware that our office is not capable of steroid injections.

## 2013-02-07 NOTE — Assessment & Plan Note (Signed)
Patient encouraged to use antiinflammatories.  Patient states Mobic is not helping with pain.  Will Rx tramadol.  Alternate Ice and moist heat.  Topical Aspercreme.  Appointment scheduled with Antoine Primas for further evaluation and [place].  Discussed [place] versus other imaging modalities.  Patient is aware that our office is not capable of steroid injections.

## 2013-02-10 ENCOUNTER — Ambulatory Visit (INDEPENDENT_AMBULATORY_CARE_PROVIDER_SITE_OTHER): Payer: BC Managed Care – PPO | Admitting: Family Medicine

## 2013-02-10 ENCOUNTER — Ambulatory Visit: Payer: BC Managed Care – PPO

## 2013-02-10 ENCOUNTER — Ambulatory Visit: Payer: BC Managed Care – PPO | Admitting: Family

## 2013-02-10 ENCOUNTER — Encounter: Payer: Self-pay | Admitting: Family Medicine

## 2013-02-10 ENCOUNTER — Other Ambulatory Visit (INDEPENDENT_AMBULATORY_CARE_PROVIDER_SITE_OTHER): Payer: BC Managed Care – PPO

## 2013-02-10 VITALS — BP 112/82 | HR 70 | Ht 64.0 in | Wt 165.0 lb

## 2013-02-10 DIAGNOSIS — M25519 Pain in unspecified shoulder: Secondary | ICD-10-CM

## 2013-02-10 DIAGNOSIS — S46001A Unspecified injury of muscle(s) and tendon(s) of the rotator cuff of right shoulder, initial encounter: Secondary | ICD-10-CM

## 2013-02-10 DIAGNOSIS — M25511 Pain in right shoulder: Secondary | ICD-10-CM

## 2013-02-10 DIAGNOSIS — S4980XA Other specified injuries of shoulder and upper arm, unspecified arm, initial encounter: Secondary | ICD-10-CM

## 2013-02-10 MED ORDER — MELOXICAM 15 MG PO TABS: 15.0000 mg | ORAL_TABLET | Freq: Every day | ORAL | Status: DC

## 2013-02-10 NOTE — Assessment & Plan Note (Signed)
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 There is no retraction and likely will respond very well to conservative therapy. Injection as described above and we'll take one week from physical exertion. Patient given home exercises to start and 40 hours per Icing protocol Meloxicam daily for 5 days Patient will return again in 3 weeks for further evaluation.

## 2013-02-10 NOTE — Patient Instructions (Signed)
Meloxicam daily for 5 days Ice 20 minutes 2 times a day Exercises starting in 48 hours.  Com back again in 3 weeks.

## 2013-02-10 NOTE — Progress Notes (Signed)
I'm seeing this patient by the request  of:  [name], [place], Marga Melnick, MD   CC: Right shoulder pain  HPI: Patient is a very pleasant 52 year old right-hand-dominant female coming in with right shoulder pain for 2 week history. Patient is a bradycardia true injury but has been working out significantly more than usual. Patient had been doing multiple classes with weight resistance. 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 Patient states that this was sharp followed by a dull chronic aching sensation that did make her feel that her arm is weak. This seemed to subside fairly quickly. Patient unfortunately continues to have a sharp pain with certain movements of the right shoulder. Patient denies any radiation of pain, any numbness or tingling. Patient denies any neck pain on the ordinary. Patient has tried anti-inflammatories with moderate improvement. Patient rates the pain 9/10.   Past medical, surgical, family and social history reviewed. Medications reviewed all in the electronic medical record.   Review of Systems: No headache, visual changes, nausea, vomiting, diarrhea, constipation, dizziness, abdominal pain, skin rash, fevers, chills, night sweats, weight loss, swollen lymph nodes, body aches, joint swelling, muscle aches, chest pain, shortness of breath, mood changes.   Objective:    Blood pressure 112/82, pulse 70, height 5\' 4"  (1.626 m), weight 165 lb (74.844 kg), last menstrual period 03/27/2001, SpO2 97.00%.   General: No apparent distress alert and oriented x3 mood and affect normal, dressed appropriately.  HEENT: Pupils equal, extraocular movements intact Respiratory: Patient's speak in full sentences and does not appear short of breath Cardiovascular: No lower extremity edema, non tender, no erythema Skin: Warm dry intact with no signs of  infection or rash on extremities or on axial skeleton. Abdomen: Soft nontender Neuro: Cranial nerves II through XII are intact, neurovascularly intact in all extremities with 2+ DTRs and 2+ pulses. Lymph: No lymphadenopathy of posterior or anterior cervical chain or axillae bilaterally.  Gait normal with good balance and coordination.  MSK: Non tender with full range of motion and good stability and symmetric strength and tone of elbows, wrist, hip, knee and ankles bilaterally.  Shoulder: Right Inspection reveals no abnormalities, atrophy or asymmetry. Palpation is normal with no tenderness over AC joint or bicipital groove. ROM is full in all planes., Patient does have mild pain no with the terminal forward flexion and abduction. Mild rotator cuff weakness on the right compared to the contralateral side with resisted external rotation Positive signs of impingement with negative Neer and Hawkin's tests, empty can sign. Speeds and Yergason's tests normal. No labral pathology noted with negative Obrien's, negative clunk and good stability. Normal scapular function observed. No painful arc and no drop arm sign. No apprehension sign Contralateral shoulder unremarkable  MSK US performed of: Right This study was ordered, performed, and interpreted by Terrilee Files D.O.  Shoulder:   Supraspinatus:  Patient does have what appears to be is very small bursal side tear. This approximately 15-20% of the tendon. There is no retraction. Significant hypoechoic changes in the area. Significant increase Doppler flow Infraspinatus:  Appears normal on long and transverse views. Mild hypoechoic changes. Subscapularis:  Appears normal on long and transverse views. Large bursa appreciated. Mild hypoechoic changes Teres Minor:  Appears normal on long and transverse views. AC joint:  Capsule undistended, no geyser sign. Glenohumeral Joint:  Appears normal without effusion. Glenoid  Labrum:  Intact without  visualized tears. Biceps Tendon:  Appears normal on long and transverse views, no fraying of tendon, tendon located in intertubercular groove, no subluxation with shoulder internal or external rotation.   impression: Small intersubstance tear of the supraspinatus with subacromial bursitis.   Procedure: Real-time Ultrasound Guided Injection of right glenohumeral joint Device: GE Logiq E  Ultrasound guided injection is preferred based studies that show increased duration, increased effect, greater accuracy, decreased procedural pain, increased response rate with ultrasound guided versus blind injection.  Verbal informed consent obtained.  Time-out conducted.  Noted no overlying erythema, induration, or other signs of local infection.  Skin prepped in a sterile fashion.  Local anesthesia: Topical Ethyl chloride.  With sterile technique and under real time ultrasound guidance:  Joint visualized.  23g 1  inch needle inserted posterior approach. Pictures taken for needle placement. Patient did have injection of 2 cc of 1% lidocaine, 2 cc of 0.5% Marcaine, and 1.0 cc of Kenalog 40 mg/dL. Completed without difficulty  Pain immediately resolved suggesting accurate placement of the medication.  Advised to call if fevers/chills, erythema, induration, drainage, or persistent bleeding.  Images permanently stored and available for review in the ultrasound unit.  Impression: Technically successful ultrasound guided injection.     Impression and Recommendations:     This case required medical decision making of moderate complexity.

## 2013-02-11 ENCOUNTER — Telehealth: Payer: Self-pay | Admitting: *Deleted

## 2013-02-11 ENCOUNTER — Ambulatory Visit: Payer: BC Managed Care – PPO | Admitting: Family Medicine

## 2013-02-11 NOTE — Telephone Encounter (Signed)
Pt called states she is having worsening pain in her shoulder.  Please advise

## 2013-02-11 NOTE — Telephone Encounter (Signed)
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 Patient having a steroid flare today, discuss treatment and what to do such as NSAIDs and icing, will return in 48 hours if not better.

## 2013-02-19 ENCOUNTER — Ambulatory Visit: Payer: BC Managed Care – PPO

## 2013-02-26 ENCOUNTER — Ambulatory Visit: Payer: BC Managed Care – PPO

## 2013-03-17 ENCOUNTER — Ambulatory Visit (INDEPENDENT_AMBULATORY_CARE_PROVIDER_SITE_OTHER): Payer: BC Managed Care – PPO | Admitting: *Deleted

## 2013-03-17 ENCOUNTER — Ambulatory Visit: Payer: BC Managed Care – PPO | Admitting: Family Medicine

## 2013-03-17 DIAGNOSIS — Z111 Encounter for screening for respiratory tuberculosis: Secondary | ICD-10-CM

## 2013-03-19 ENCOUNTER — Encounter: Payer: Self-pay | Admitting: *Deleted

## 2013-03-19 LAB — TB SKIN TEST
Induration: 0 mm
TB Skin Test: NEGATIVE

## 2013-03-25 ENCOUNTER — Ambulatory Visit (INDEPENDENT_AMBULATORY_CARE_PROVIDER_SITE_OTHER): Payer: BC Managed Care – PPO | Admitting: Family Medicine

## 2013-03-25 ENCOUNTER — Encounter: Payer: Self-pay | Admitting: Family Medicine

## 2013-03-25 VITALS — BP 118/78 | HR 65

## 2013-03-25 DIAGNOSIS — S46001A Unspecified injury of muscle(s) and tendon(s) of the rotator cuff of right shoulder, initial encounter: Secondary | ICD-10-CM

## 2013-03-25 DIAGNOSIS — S4980XA Other specified injuries of shoulder and upper arm, unspecified arm, initial encounter: Secondary | ICD-10-CM

## 2013-03-25 MED ORDER — NITROGLYCERIN 0.2 MG/HR TD PT24: MEDICATED_PATCH | TRANSDERMAL | Status: DC

## 2013-03-25 NOTE — Assessment & Plan Note (Signed)
Patient has been approximately 85% improvement since last visit. Patient will start nitroglycerin and warned of potential side effects. Patient will also go to formal physical therapy to make sure home exercise program is going well. Discuss continuing icing as well. Patient and will followup again in 3-4 weeks for further evaluation. The patient continues to do well we'll continue with conservative therapy. She has pain we need to consider further imaging such as an MRI.

## 2013-03-25 NOTE — Progress Notes (Signed)
Pre-visit discussion using our clinic review tool. No additional management support is needed unless otherwise documented below in the visit note.  

## 2013-03-25 NOTE — Progress Notes (Signed)
  CC: Right shoulder pain follow up  HPI: Patient is a very pleasant 52 year old right-hand-dominant female coming in with right shoulder pain follow up. Patient was seen previously 6 weeks ago and was given an intra-articular injection. Patient did have a steroid flare 24 hours after the injection but it resolved. Patient was to followup in 3 weeks but was feeling good supposed appointment back to 6 weeks. Patient did anti-inflammatories, icing, and intermittently did the home exercise program. Patient states she is doing approximately 80% better. Patient started doing more aerobic classes and started having a little bit more increased frequency pain. Patient is no longer taking any medications for this. Patient still icing from time to time that is helpful. Patient would like to become more active and likes make sure that she can do this in a safe way. Denies any new symptoms.   Past medical, surgical, family and social history reviewed. Medications reviewed all in the electronic medical record.   Review of Systems: No headache, visual changes, nausea, vomiting, diarrhea, constipation, dizziness, abdominal pain, skin rash, fevers, chills, night sweats, weight loss, swollen lymph nodes, body aches, joint swelling, muscle aches, chest pain, shortness of breath, mood changes.   Objective:    Blood pressure 118/78, pulse 65, last menstrual period 03/27/2001, SpO2 98.00%.   General: No apparent distress alert and oriented x3 mood and affect normal, dressed appropriately.  HEENT: Pupils equal, extraocular movements intact Respiratory: Patient's speak in full sentences and does not appear short of breath Cardiovascular: No lower extremity edema, non tender, no erythema Skin: Warm dry intact with no signs of infection or rash on extremities or on axial skeleton. Abdomen: Soft nontender Neuro: Cranial nerves II through XII are intact, neurovascularly intact in all extremities with 2+ DTRs and 2+  pulses. Lymph: No lymphadenopathy of posterior or anterior cervical chain or axillae bilaterally.  Gait normal with good balance and coordination.  MSK: Non tender with full range of motion and good stability and symmetric strength and tone of elbows, wrist, hip, knee and ankles bilaterally.  Shoulder: Right Inspection reveals no abnormalities, atrophy or asymmetry. Palpation is normal with no tenderness over AC joint or bicipital groove. ROM is full in all planes.,  Mild rotator cuff weakness on the right compared to the contralateral side with resisted external rotation Negative signs of impingement with negative Neer and Hawkin's tests, empty can sign. Speeds and Yergason's tests normal. No labral pathology noted with negative Obrien's, negative clunk and good stability. Normal scapular function observed. No painful arc and no drop arm sign. No apprehension sign Contralateral shoulder unremarkable       Impression and Recommendations:     This case required medical decision making of moderate complexity.

## 2013-03-25 NOTE — Patient Instructions (Signed)
Very good to see you Happy New year Physical therapy will be calling you.  Nitroglycerin Protocol   Apply 1/4 nitroglycerin patch to affected area daily.  Change position of patch within the affected area every 24 hours.  You may experience a headache during the first 1-2 weeks of using the patch, these should subside.  If you experience headaches after beginning nitroglycerin patch treatment, you may take your preferred over the counter pain reliever.  Another side effect of the nitroglycerin patch is skin irritation or rash related to patch adhesive.  Please notify our office if you develop more severe headaches or rash, and stop the patch.  Tendon healing with nitroglycerin patch may require 12 to 24 weeks depending on the extent of injury.  Men should not use if taking Viagra, Cialis, or Levitra.   Do not use if you have migraines or rosacea.   Ice after activity  Come back in 4 weeks.

## 2013-04-07 ENCOUNTER — Ambulatory Visit (INDEPENDENT_AMBULATORY_CARE_PROVIDER_SITE_OTHER): Payer: BC Managed Care – PPO | Admitting: Internal Medicine

## 2013-04-07 ENCOUNTER — Encounter: Payer: Self-pay | Admitting: Internal Medicine

## 2013-04-07 VITALS — BP 110/60 | HR 64 | Temp 98.0°F | Wt 162.0 lb

## 2013-04-07 DIAGNOSIS — Z23 Encounter for immunization: Secondary | ICD-10-CM

## 2013-04-07 DIAGNOSIS — K219 Gastro-esophageal reflux disease without esophagitis: Secondary | ICD-10-CM

## 2013-04-07 DIAGNOSIS — R252 Cramp and spasm: Secondary | ICD-10-CM

## 2013-04-07 DIAGNOSIS — R21 Rash and other nonspecific skin eruption: Secondary | ICD-10-CM

## 2013-04-07 NOTE — Progress Notes (Signed)
Subjective:    Patient ID: [name], female    DOB: 1960-10-31, 53 y.o.   MRN: [id number]  HPI She has had excessive right leg cramps and pain (10/10) for last 8-10 days. Cramps can be isolated in right upper leg or foot or travel from upper leg to foot. States she has "sciatica" in right leg. Cramps can happen up to 6 times per day and last for 2-3 minutes & can happen day and night. Ibuprofen helps pain. She has been increasing fluids and salt  but hasn't helped. Heat and cold do not help. She walks 2-3 miles every day and does Zumba three times/week.   Review of Systems Had intermittent slightly pruritic rash over right posterior calf, right inner arm, and right shoulder within the last 8-10 days which has resolved. Also complains of hypersensitivity on back of right thigh and right flank. She has heart burn with most meals.She has upper esophageal dysphagia occasionally. No unexplained weight loss, abdominal pain, melena, rectal bleeding, or small caliber stools.     Objective:   Physical Exam   Gen.: Healthy and well-nourished in appearance. Alert, appropriate and cooperative throughout exam.Appears younger than stated age   Eyes: No corneal or conjunctival inflammation noted. Pupils equal round reactive to light and accommodation. Extraocular motion intact.   Nose: External nasal exam reveals no deformity or inflammation. Nasal mucosa are pink and moist. No lesions or exudates noted.   Mouth: Oral mucosa and oropharynx reveal no lesions or exudates.Minor erythema. Asymmetry to posterior pharynx, shelf on R.Teeth in good repair. Neck: No deformities, masses, or tenderness noted. Range of motion &Thyroid normal. Lungs: Normal respiratory effort; chest expands symmetrically. Lungs are clear to auscultation without rales, wheezes, or increased work of breathing. Heart: Normal rate and rhythm. Normal S1 and S2. No gallop, click, or rub. No murmur. Abdomen: Bowel sounds normal;  abdomen soft and nontender. No masses, organomegaly or hernias noted.                                   Musculoskeletal/extremities: No deformity or scoliosis noted of  the thoracic or lumbar spine.    No clubbing, cyanosis, edema, or significant extremity  deformity noted. Range of motion normal .Tone & strength normal. Hand joints normal . Fingernail  health good. Able to lie down & sit up w/o help. Negative Homan's bilaterally Vascular: Carotid, radial artery, dorsalis pedis and  posterior tibial pulses are full and equal. No bruits present. Neurologic: Alert and oriented x3. Deep tendon reflexes symmetrical and normal.  Gait normal  including heel & toe walking .        Skin: Intact without suspicious lesions or rashes. No dermatographia Lymph: No cervical, axillary lymphadenopathy present. Psych: Mood and affect are normal. Normally interactive                                                                                        Assessment & Plan:  #1 leg cramps in the context of high level contrast exercise  #2 reflux  #3 transient skin lesions,  possible hives/urticaria  Plan: See orders

## 2013-04-07 NOTE — Patient Instructions (Addendum)
Reflux of gastric acid may be asymptomatic as this may occur mainly during sleep.The triggers for reflux  include stress; the "aspirin family" ; alcohol; peppermint; and caffeine (coffee, tea, cola, and chocolate). The aspirin family would include aspirin and the nonsteroidal agents such as ibuprofen &  Naproxen.  Tylenol would not cause reflux. If having symptoms ; food & drink should be avoided for @ least 2 hours before going to bed.  If symptoms persist ,take the ranitidine 30 minutes before breakfast and evening meal. If this does not control reflux symptoms;start omeprazole 20 mg  30 minutes before breakfast in place of the ranitidine. Please perform isometric exercises before going to bed; Magcal or Calmag may help the muscle symptoms as we discussed.

## 2013-04-08 LAB — CK: Total CK: 92 U/L (ref 7–177)

## 2013-04-08 LAB — MAGNESIUM: Magnesium: 2.1 mg/dL (ref 1.5–2.5)

## 2013-04-08 LAB — POTASSIUM: POTASSIUM: 4.1 meq/L (ref 3.5–5.1)

## 2013-04-08 LAB — CALCIUM: Calcium: 9.2 mg/dL (ref 8.4–10.5)

## 2013-04-09 ENCOUNTER — Encounter: Payer: Self-pay | Admitting: *Deleted

## 2013-04-12 LAB — VITAMIN D 1,25 DIHYDROXY
VITAMIN D 1, 25 (OH) TOTAL: 47 pg/mL (ref 18–72)
VITAMIN D3 1, 25 (OH): 47 pg/mL

## 2013-04-14 ENCOUNTER — Telehealth: Payer: Self-pay | Admitting: *Deleted

## 2013-04-14 NOTE — Telephone Encounter (Signed)
Left message on patient cell to inform that all lab results were WNL.

## 2013-04-16 ENCOUNTER — Encounter: Payer: BC Managed Care – PPO | Admitting: Internal Medicine

## 2013-04-22 ENCOUNTER — Ambulatory Visit: Payer: BC Managed Care – PPO | Admitting: Family Medicine

## 2013-04-22 DIAGNOSIS — Z0289 Encounter for other administrative examinations: Secondary | ICD-10-CM

## 2013-09-02 ENCOUNTER — Encounter: Payer: Self-pay | Admitting: Family Medicine

## 2013-09-02 ENCOUNTER — Ambulatory Visit (INDEPENDENT_AMBULATORY_CARE_PROVIDER_SITE_OTHER): Payer: BC Managed Care – PPO | Admitting: Family Medicine

## 2013-09-02 VITALS — BP 116/78 | HR 56 | Ht 64.0 in | Wt 163.0 lb

## 2013-09-02 DIAGNOSIS — M999 Biomechanical lesion, unspecified: Secondary | ICD-10-CM | POA: Insufficient documentation

## 2013-09-02 DIAGNOSIS — M9981 Other biomechanical lesions of cervical region: Secondary | ICD-10-CM

## 2013-09-02 DIAGNOSIS — M542 Cervicalgia: Secondary | ICD-10-CM

## 2013-09-02 NOTE — Assessment & Plan Note (Signed)
Decision today to treat with OMT was based on Physical Exam  After verbal consent patient was treated with HVLA techniques in cervical, thoracic and lumbar areas  Patient tolerated the procedure well with improvement in symptoms  Patient given exercises, stretches and lifestyle modifications  See medications in patient instructions if given  Patient will follow up in 2-3 weeks

## 2013-09-02 NOTE — Patient Instructions (Signed)
Good to see you Aleve 2 times a day for 3 days Ice after activity can help Wall exercise with heels, butt, shoulder and head against wall for total of 5 minutes daily.  Look at handout and do 3-4 times a week.  Turmeric 500mg  twice daily Vitamin D 2000 IU daily Come back in 2-3 weeks to make sure better and maybe manipulation

## 2013-09-02 NOTE — Progress Notes (Signed)
  [facility] [address], [place] [address] Phone: [phone] Subjective:    CC: Neck pain  [id number] [name] is a 53 y.o. female coming in with complaint of neck pain. Patient states that this has started over the course of the last 2 weeks. Patient does not remember any true injury. Patient states that this pain seems to be more of a nagging sensation to can have sharp pain with certain movements but then seems to dissipate after several seconds to minutes. Patient states that he can be uncomfortable at night but does not wake her up. States that the radiation can go up towards her head but never down her arms and denies any weakness in numbness in the extremities.      Past medical history, social, surgical and family history all reviewed in electronic medical record.   Review of Systems: No headache, visual changes, nausea, vomiting, diarrhea, constipation, dizziness, abdominal pain, skin rash, fevers, chills, night sweats, weight loss, swollen lymph nodes, body aches, joint swelling, muscle aches, chest pain, shortness of breath, mood changes.   Objective Blood pressure 116/78, pulse 56, height 5\' 4"  (1.626 m), weight 163 lb (73.936 kg), last menstrual period 03/27/2001, SpO2 97.00%.  General: No apparent distress alert and oriented x3 mood and affect normal, dressed appropriately.  HEENT: Pupils equal, extraocular movements intact  Respiratory: Patient's speak in full sentences and does not appear short of breath  Cardiovascular: No lower extremity edema, non tender, no erythema  Skin: Warm dry intact with no signs of infection or rash on extremities or on axial skeleton.  Abdomen: Soft nontender  Neuro: Cranial nerves II through XII are intact, neurovascularly intact in all extremities with 2+ DTRs and 2+ pulses.  Lymph: No lymphadenopathy of posterior or anterior cervical chain or axillae bilaterally.  Gait normal with good balance  and coordination.  MSK:  Non tender with full range of motion and good stability and symmetric strength and tone of shoulders, elbows, wrist, hip, knee and ankles bilaterally.  Neck: Inspection unremarkable. No palpable stepoffs. Negative Spurling's maneuver. Mild decreased range of motion with left-sided rotation and right side side bending. Grip strength and sensation normal in bilateral hands Strength good C4 to T1 distribution No sensory change to C4 to T1 Negative Hoffman sign bilaterally Reflexes normal No spinous process tenderness.  OMT Physical Exam   Cervical  C4 flexed rotated and side bent right C7 flexed rotated and side bent left.  Thoracic T2 extended rotated inside that right toe with elevated second rib.  Lumbar L2 flexed rotated and side bent right         Impression and Recommendations:     This case required medical decision making of moderate complexity.

## 2013-09-02 NOTE — Assessment & Plan Note (Signed)
Patient did respond very well to osteopathic manipulate today. We discussed home exercises and was given a handout and showed proper formation. We discussed icing protocol and over-the-counter medications he can be beneficial. Patient is to follow up again in 2-3 weeks. For further evaluation and treatment.

## 2013-09-15 ENCOUNTER — Ambulatory Visit: Payer: BC Managed Care – PPO | Admitting: Family

## 2013-09-15 ENCOUNTER — Ambulatory Visit (INDEPENDENT_AMBULATORY_CARE_PROVIDER_SITE_OTHER): Payer: BC Managed Care – PPO | Admitting: Physician Assistant

## 2013-09-15 ENCOUNTER — Encounter: Payer: Self-pay | Admitting: Physician Assistant

## 2013-09-15 VITALS — BP 112/68 | HR 76 | Temp 98.5°F | Resp 16 | Ht 64.0 in | Wt 161.8 lb

## 2013-09-15 DIAGNOSIS — L259 Unspecified contact dermatitis, unspecified cause: Secondary | ICD-10-CM

## 2013-09-15 MED ORDER — PREDNISONE 10 MG PO TABS: ORAL_TABLET | ORAL | Status: DC

## 2013-09-15 MED ORDER — PREDNISONE 10 MG PO TABS: 10.0000 mg | ORAL_TABLET | Freq: Every day | ORAL | Status: DC

## 2013-09-15 NOTE — Progress Notes (Signed)
Pre visit review using our clinic review tool, if applicable. No additional management support is needed unless otherwise documented below in the visit note/SLS  

## 2013-09-15 NOTE — Patient Instructions (Signed)
Please take prednisone as directed -- 4 tablets x 2 days, then 3 tablets x 2 days, then 2 tablets x 2 days, then 1 tablet x 2 days. Apply cool compresses.  Take benadryl daily.  Please call or return to clinic if symptoms are not improving.

## 2013-09-16 DIAGNOSIS — L259 Unspecified contact dermatitis, unspecified cause: Secondary | ICD-10-CM | POA: Insufficient documentation

## 2013-09-16 NOTE — Assessment & Plan Note (Signed)
Rx Prednisone taper.  Claritin daily.  Cool compresses. Topical Cortisone lotion.  Avoid heat.  Return precautions given.

## 2013-09-16 NOTE — Progress Notes (Signed)
Patient presents to clinic today c/o pruritic rash of chest arms and legs bilaterally x 5 days.  Rash first noticed after working out in the yard.  Denies new soaps, lotions, detergents or other products.  Denis SOB, wheezing or facial swelling.  Past Medical History  Diagnosis Date  . Anemia     NOS  . Allergy     ALSO DRY EYES  . Hyperlipidemia   . History of colonic polyps   . Hyperglycemia   . 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 Melvyn Novas  . Villous adenoma 1997    Current Outpatient Prescriptions on File Prior to Visit  Medication Sig Dispense Refill  . cetirizine (ZYRTEC) 10 MG tablet Take 10 mg by mouth daily as needed for allergies.      . diphenhydrAMINE (BENADRYL) 25 MG tablet Take 25 mg by mouth as needed.       No current facility-administered medications on file prior to visit.    Allergies  Allergen Reactions  . Tramadol Nausea Only  . Codeine     REACTION: GI UPSET    Family History  Problem Relation Age of Onset  . Colon cancer Father 2  . Hyperlipidemia Brother   . Colon cancer Maternal Aunt   . Diabetes Paternal Uncle   . Asthma Maternal Grandmother   . Leukemia Father     History   Social History  . Marital Status: Single    Spouse Name: N/A    Number of Children: N/A  . Years of Education: N/A   Occupational History  . teacher    Social History Main Topics  . Smoking status: Former Smoker    Quit date: 03/27/2001  . Smokeless tobacco: None  . Alcohol Use: No  . Drug Use:   . Sexual Activity:    Other Topics Concern  . None   Social History Narrative   Gets regular exercise         Review of Systems - See HPI.  All other ROS are negative.  BP 112/68  Pulse 76  Temp(Src) 98.5 F (36.9 C) (Oral)  Resp 16  Ht 5\' 4"  (1.626 m)  Wt 161 lb 12 oz (73.369 kg)  BMI 27.75 kg/m2  SpO2 98%  LMP 03/27/2001  Physical Exam  Vitals reviewed. Constitutional: She is oriented to person, place, and time and well-developed,  well-nourished, and in no distress.  HENT:  Head: Normocephalic and atraumatic.  Mouth/Throat: Oropharynx is clear and moist. No oropharyngeal exudate.  Cardiovascular: Normal rate, regular rhythm, normal heart sounds and intact distal pulses.   Pulmonary/Chest: Effort normal and breath sounds normal. No respiratory distress. She has no wheezes. She has no rales. She exhibits no tenderness.  Neurological: She is alert and oriented to person, place, and time.  Skin: Skin is warm and dry.  Presence of a erythematous maculopapular rash in the center of chest.  Also noted on bilateral upper and lower extremities.  Psychiatric: Affect normal.   Assessment/Plan: Contact dermatitis Rx Prednisone taper.  Claritin daily.  Cool compresses. Topical Cortisone lotion.  Avoid heat.  Return precautions given.

## 2013-09-23 ENCOUNTER — Telehealth: Payer: Self-pay | Admitting: *Deleted

## 2013-09-23 DIAGNOSIS — L259 Unspecified contact dermatitis, unspecified cause: Secondary | ICD-10-CM

## 2013-09-23 MED ORDER — CLOBETASOL PROPIONATE 0.05 % EX OINT: 1.0000 "application " | TOPICAL_OINTMENT | Freq: Two times a day (BID) | CUTANEOUS | Status: DC

## 2013-09-23 NOTE — Telephone Encounter (Signed)
Pt called stating she has not started prednisone dose pack yet because she is afraid of the side effects. States she took this in the past and it caused her to be agitated, couldn't sleep and caused her to have a tremor. Pt states she would prefer a topical cream if possible. Pt states it is ok to leave a detailed message on voicemail. Please advise.

## 2013-09-23 NOTE — Telephone Encounter (Signed)
Notified pt and she voices understanding. Pt states she has talked with her pharmacist and thinks she may still try the prednisone dosepack as well. Advised pt per verbal from NP, O'sullivan that it is ok to do both together.

## 2013-09-23 NOTE — Telephone Encounter (Signed)
We can attempt a topical steroid.  I will call in Clobetasol to her pharmacy.  She should apply as directed.  I cannot promise that it will give her as good of a relief as the pills, but we can try.  She is not to apply this to her face, groin or axillary regions.

## 2013-11-05 ENCOUNTER — Encounter: Payer: Self-pay | Admitting: Physician Assistant

## 2013-11-05 ENCOUNTER — Ambulatory Visit (INDEPENDENT_AMBULATORY_CARE_PROVIDER_SITE_OTHER): Payer: BC Managed Care – PPO | Admitting: Physician Assistant

## 2013-11-05 VITALS — BP 122/74 | HR 73 | Temp 98.4°F | Resp 16 | Ht 64.0 in | Wt 160.0 lb

## 2013-11-05 DIAGNOSIS — L259 Unspecified contact dermatitis, unspecified cause: Secondary | ICD-10-CM

## 2013-11-05 DIAGNOSIS — H04129 Dry eye syndrome of unspecified lacrimal gland: Secondary | ICD-10-CM

## 2013-11-05 DIAGNOSIS — H04123 Dry eye syndrome of bilateral lacrimal glands: Secondary | ICD-10-CM

## 2013-11-05 DIAGNOSIS — L239 Allergic contact dermatitis, unspecified cause: Secondary | ICD-10-CM

## 2013-11-05 MED ORDER — CYCLOSPORINE 0.05 % OP EMUL: 1.0000 [drp] | Freq: Two times a day (BID) | OPHTHALMIC | Status: DC

## 2013-11-05 MED ORDER — METHYLPREDNISOLONE (PAK) 4 MG PO TABS: ORAL_TABLET | ORAL | Status: DC

## 2013-11-05 NOTE — Progress Notes (Signed)
Patient presents to clinic today c/o redness and irritation of her eyes over the past week.  Has history of dry eyes and seasonal allergies.  Does endorse sneezing and watery nose.  Denies photophobia or vision changes. Denies recent travel.  Denies drainage or matting of eye.  Patient also noticed a itchy rash over bilateral upper extremities and torso after being out in the heat.  Denies change to hygiene products. Denies pets.   Past Medical History  Diagnosis Date  . Anemia     NOS  . Allergy     ALSO DRY EYES  . Hyperlipidemia   . History of colonic polyps   . Hyperglycemia   . 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 Melvyn Novas  . Villous adenoma 1997    Current Outpatient Prescriptions on File Prior to Visit  Medication Sig Dispense Refill  . cetirizine (ZYRTEC) 10 MG tablet Take 10 mg by mouth daily as needed for allergies.      . clobetasol ointment (TEMOVATE) 5.18 % Apply 1 application topically 2 (two) times daily.  30 g  0  . diphenhydrAMINE (BENADRYL) 25 MG tablet Take 25 mg by mouth as needed.      . meloxicam (MOBIC) 15 MG tablet Take 15 mg by mouth daily as needed.      . naproxen sodium (ANAPROX) 220 MG tablet Take 220 mg by mouth as needed.       No current facility-administered medications on file prior to visit.    Allergies  Allergen Reactions  . Tramadol Nausea Only  . Codeine     REACTION: GI UPSET    Family History  Problem Relation Age of Onset  . Colon cancer Father 58  . Hyperlipidemia Brother   . Colon cancer Maternal Aunt   . Diabetes Paternal Uncle   . Asthma Maternal Grandmother   . Leukemia Father     History   Social History  . Marital Status: Single    Spouse Name: N/A    Number of Children: N/A  . Years of Education: N/A   Occupational History  . teacher    Social History Main Topics  . Smoking status: Former Smoker    Quit date: 03/27/2001  . Smokeless tobacco: None  . Alcohol Use: No  . Drug Use:   . Sexual  Activity:    Other Topics Concern  . None   Social History Narrative   Gets regular exercise          Review of Systems - See HPI.  All other ROS are negative.  BP 122/74  Pulse 73  Temp(Src) 98.4 F (36.9 C) (Oral)  Resp 16  Ht 5\' 4"  (1.626 m)  Wt 160 lb (72.576 kg)  BMI 27.45 kg/m2  SpO2 98%  LMP 03/27/2001  Physical Exam  Vitals reviewed. Constitutional: She is oriented to person, place, and time and well-developed, well-nourished, and in no distress.  HENT:  Head: Normocephalic and atraumatic.  Right Ear: Tympanic membrane and ear canal normal.  Left Ear: Tympanic membrane and ear canal normal.  Nose: Mucosal edema and rhinorrhea present. Right sinus exhibits no maxillary sinus tenderness and no frontal sinus tenderness. Left sinus exhibits no maxillary sinus tenderness and no frontal sinus tenderness.  Mouth/Throat: Uvula is midline, oropharynx is clear and moist and mucous membranes are normal.  Eyes: Conjunctivae are normal. Pupils are equal, round, and reactive to light. Right eye exhibits no discharge and no exudate. No foreign body present in  the right eye. Left eye exhibits no discharge and no exudate. No foreign body present in the left eye. No scleral icterus.  Mild injection bilaterally more consistent with irritant conjunctivitis and chronic dry eyes.  No entropion noted.  Cardiovascular: Normal rate, regular rhythm, normal heart sounds and intact distal pulses.   Pulmonary/Chest: Effort normal and breath sounds normal. No respiratory distress. She has no wheezes. She has no rales. She exhibits no tenderness.  Neurological: She is alert and oriented to person, place, and time.  Skin: Skin is warm and dry.  Maculopapular rash of extremities and torsos noted on exam -- consistent with allergic dermatitis.  Psychiatric: Affect normal.   Assessment/Plan: Chronically dry eyes Rx Restasis.  Stay well hydrated.  Due to chronicity and intermittent drymouth symptoms,  recommended labs for Sjogren's.  Patient declines at present.  Allergic dermatitis Rx Medrol pack.  Sarna lotion.  Benadryl daily.  Cool compresses.  Referral placed to Allergist due to recurrence of symptoms.

## 2013-11-05 NOTE — Progress Notes (Signed)
Pre visit review using our clinic review tool, if applicable. No additional management support is needed unless otherwise documented below in the visit note/SLS  

## 2013-11-05 NOTE — Patient Instructions (Signed)
Please take medications as directed. Take a Benadryl at night.  You may use the Clobetasol cream as well.  I recommend you get some over-the-counter Sarna lotion to help with the itch.  Apply cool compresses.  I am setting you up with an [place] will be contacted for an appointment.

## 2013-11-06 ENCOUNTER — Other Ambulatory Visit: Payer: Self-pay | Admitting: *Deleted

## 2013-11-06 DIAGNOSIS — H04123 Dry eye syndrome of bilateral lacrimal glands: Secondary | ICD-10-CM

## 2013-11-06 DIAGNOSIS — H04129 Dry eye syndrome of unspecified lacrimal gland: Secondary | ICD-10-CM | POA: Insufficient documentation

## 2013-11-06 DIAGNOSIS — L239 Allergic contact dermatitis, unspecified cause: Secondary | ICD-10-CM | POA: Insufficient documentation

## 2013-11-06 MED ORDER — CYCLOSPORINE 0.05 % OP EMUL: 1.0000 [drp] | Freq: Two times a day (BID) | OPHTHALMIC | Status: DC

## 2013-11-06 NOTE — Assessment & Plan Note (Signed)
Rx Restasis.  Stay well hydrated.  Due to chronicity and intermittent drymouth symptoms, recommended labs for Sjogren's.  Patient declines at present.

## 2013-11-06 NOTE — Assessment & Plan Note (Signed)
Rx Medrol pack.  Sarna lotion.  Benadryl daily.  Cool compresses.  Referral placed to Allergist due to recurrence of symptoms.

## 2013-11-06 NOTE — Progress Notes (Signed)
Per fax request from pharmacy to correct #dispense/SLS

## 2014-02-10 ENCOUNTER — Ambulatory Visit: Payer: BC Managed Care – PPO | Admitting: Family Medicine

## 2014-02-14 ENCOUNTER — Ambulatory Visit (INDEPENDENT_AMBULATORY_CARE_PROVIDER_SITE_OTHER): Payer: BC Managed Care – PPO | Admitting: Family Medicine

## 2014-02-14 ENCOUNTER — Encounter: Payer: Self-pay | Admitting: Family Medicine

## 2014-02-14 VITALS — BP 112/80 | Temp 98.1°F | Wt 163.0 lb

## 2014-02-14 DIAGNOSIS — H04123 Dry eye syndrome of bilateral lacrimal glands: Secondary | ICD-10-CM

## 2014-02-14 DIAGNOSIS — J0110 Acute frontal sinusitis, unspecified: Secondary | ICD-10-CM

## 2014-02-14 MED ORDER — FLUTICASONE PROPIONATE 50 MCG/ACT NA SUSP: 2.0000 | Freq: Every day | NASAL | Status: DC

## 2014-02-14 MED ORDER — CEFUROXIME AXETIL 500 MG PO TABS: 500.0000 mg | ORAL_TABLET | Freq: Two times a day (BID) | ORAL | Status: AC

## 2014-02-14 NOTE — Patient Instructions (Signed)

## 2014-02-14 NOTE — Progress Notes (Signed)
  Subjective:     [name] is a 53 y.o. female who presents for evaluation of sinus pain. Symptoms include: congestion, cough, facial pain, fevers, headaches, nasal congestion and sneezing. Onset of symptoms was 2 weeks ago. Symptoms have been gradually worsening since that time. Past history is significant for no history of pneumonia or bronchitis. Patient is a non-smoker.  The following portions of the patient's history were reviewed and updated as appropriate:  She  has a past medical history of Anemia; Allergy; Hyperlipidemia; History of colonic polyps; Hyperglycemia; Bilateral hilar adenopathy syndrome (01/21/2009); and Villous adenoma (1997). She  does not have any pertinent problems on file. She  has past surgical history that includes Abdominal hysterectomy; Fetal blood transfusion (03/2001); Tonsillectomy; Partial colectomy (1997); and Colonoscopy. Her family history includes Asthma in her maternal grandmother; Colon cancer in her maternal aunt; Colon cancer (age of onset: 22) in her father; Diabetes in her paternal uncle; Hyperlipidemia in her brother; Leukemia in her father. She  reports that she quit smoking about 12 years ago. She does not have any smokeless tobacco history on file. She reports that she does not drink alcohol. Her drug history is not on file. She has a current medication list which includes the following prescription(s): diphenhydramine, carboxymethylcellulose, cetirizine, clobetasol ointment, and meloxicam. Current Outpatient Prescriptions on File Prior to Visit  Medication Sig Dispense Refill  . diphenhydrAMINE (BENADRYL) 25 MG tablet Take 25 mg by mouth as needed.    . carboxymethylcellulose (REFRESH PLUS) 0.5 % SOLN 2 drops 3 (three) times daily as needed.    . cetirizine (ZYRTEC) 10 MG tablet Take 10 mg by mouth daily as needed for allergies.    . clobetasol ointment (TEMOVATE) 2.19 % Apply 1 application topically 2 (two) times daily. 30 g 0  . meloxicam  (MOBIC) 15 MG tablet Take 15 mg by mouth daily as needed.     No current facility-administered medications on file prior to visit.   She is allergic to tramadol and codeine..  Review of Systems Pertinent items are noted in HPI.   Objective:    BP 112/80 mmHg  Temp(Src) 98.1 F (36.7 C)  Wt 163 lb (73.936 kg)  LMP 03/27/2001 General appearance: alert, cooperative, appears stated age and no distress Head: Normocephalic, without obvious abnormality, atraumatic Eyes: positive findings: dry injected eyes b/l Ears: normal TM's and external ear canals both ears Nose: yellow discharge, moderate congestion, turbinates red, swollen, sinus tenderness right Throat: lips, mucosa, and tongue normal; teeth and gums normal Neck: no adenopathy, no carotid bruit, supple, symmetrical, trachea midline and thyroid not enlarged, symmetric, no tenderness/mass/nodules Lungs: clear to auscultation bilaterally Heart: S1, S2 normal    Assessment:    Acute bacterial sinusitis.    Plan:    Nasal steroids per medication orders. Antihistamines per medication orders. Ceftin per medication orders.

## 2014-02-16 ENCOUNTER — Telehealth: Payer: Self-pay

## 2014-02-16 NOTE — Telephone Encounter (Signed)
To: [facility] (After Hours Triage) Fax: [phone] From: [facility]  Date/ Time:02/14/2014 7:28 AM Taken VN:[id number] Messer, CSR Caller: [place]: not collected Patient: [name], Horn DOB: [birth year] Phone: [phone] Reason for Call: See info below Regarding Appointment: Yes Appt Date: Appt Time: 11:15:00 AM Provider: Reason: Scheduled Appointment Details: headache on right side Outcome: Scheduled appointment in

## 2014-02-16 NOTE — Telephone Encounter (Signed)
Pt was seen by Dr. Etter Sjogren on 02/14/14.

## 2014-03-25 ENCOUNTER — Encounter: Payer: Self-pay | Admitting: Family Medicine

## 2014-03-25 ENCOUNTER — Ambulatory Visit (INDEPENDENT_AMBULATORY_CARE_PROVIDER_SITE_OTHER): Payer: BC Managed Care – PPO | Admitting: Family Medicine

## 2014-03-25 VITALS — BP 118/82 | HR 59 | Ht 64.0 in | Wt 162.0 lb

## 2014-03-25 DIAGNOSIS — M9903 Segmental and somatic dysfunction of lumbar region: Secondary | ICD-10-CM

## 2014-03-25 DIAGNOSIS — M542 Cervicalgia: Secondary | ICD-10-CM

## 2014-03-25 DIAGNOSIS — M9901 Segmental and somatic dysfunction of cervical region: Secondary | ICD-10-CM

## 2014-03-25 DIAGNOSIS — M999 Biomechanical lesion, unspecified: Secondary | ICD-10-CM

## 2014-03-25 DIAGNOSIS — M9902 Segmental and somatic dysfunction of thoracic region: Secondary | ICD-10-CM

## 2014-03-25 MED ORDER — GABAPENTIN 100 MG PO CAPS: 100.0000 mg | ORAL_CAPSULE | Freq: Every day | ORAL | Status: DC

## 2014-03-25 NOTE — Patient Instructions (Signed)
Good to see you Gabapentin 100mg  at night Xrays downstairs today.  Keep within peripheral vision.  Keep neck in neutral position with lifting.  Keep working out.  See me again in 3-4 weeks.

## 2014-03-25 NOTE — Progress Notes (Signed)
  [name] [address], [place] [address] Phone: [phone] Subjective:    CC: Neck pain right side follow up/.   [id number] [name] is a 53 y.o. female coming in with complaint of neck pain. Patient was seen previously and did respond well to osteopathic manipulation. Patient has started working out on a more regular basis and when she does overhead activity it seems to be worse. Patient states that recently she has been having a throbbing sensation on the right side of her neck. Denies any radiation down the arm or any numbness or tingling. Patient was concern that she may have a impingement in the neck. Patient still can move her head and continue with daily activities. Still sleeping relatively well at night. Has not had to take any significant medications.     Past medical history, social, surgical and family history all reviewed in electronic medical record.   Review of Systems: No headache, visual changes, nausea, vomiting, diarrhea, constipation, dizziness, abdominal pain, skin rash, fevers, chills, night sweats, weight loss, swollen lymph nodes, body aches, joint swelling, muscle aches, chest pain, shortness of breath, mood changes.   Objective Blood pressure 118/82, pulse 59, height 5\' 4"  (1.626 m), weight 162 lb (73.483 kg), last menstrual period 03/27/2001, SpO2 98 %.  General: No apparent distress alert and oriented x3 mood and affect normal, dressed appropriately.  HEENT: Pupils equal, extraocular movements intact  Respiratory: Patient's speak in full sentences and does not appear short of breath  Cardiovascular: No lower extremity edema, non tender, no erythema  Skin: Warm dry intact with no signs of infection or rash on extremities or on axial skeleton.  Abdomen: Soft nontender  Neuro: Cranial nerves II through XII are intact, neurovascularly intact in all extremities with 2+ DTRs and 2+ pulses.  Lymph: No lymphadenopathy  of posterior or anterior cervical chain or axillae bilaterally.  Gait normal with good balance and coordination.  MSK:  Non tender with full range of motion and good stability and symmetric strength and tone of shoulders, elbows, wrist, hip, knee and ankles bilaterally.  Neck: Inspection unremarkable. No palpable stepoffs. Negative Spurling's maneuver. Improved range of motion from previous exam Grip strength and sensation normal in bilateral hands Strength good C4 to T1 distribution No sensory change to C4 to T1 Negative Hoffman sign bilaterally Reflexes normal No spinous process tenderness.  OMT Physical Exam   Cervical  C4 flexed rotated and side bent right C7 flexed rotated and side bent left.  Thoracic T2 extended rotated inside that right toe with elevated second rib.  Lumbar L2 flexed rotated and side bent right         Impression and Recommendations:     This case required medical decision making of moderate complexity.

## 2014-03-25 NOTE — Assessment & Plan Note (Signed)
Decision today to treat with OMT was based on Physical Exam  After verbal consent patient was treated with HVLA techniques in cervical, thoracic and lumbar areas  Patient tolerated the procedure well with improvement in symptoms  Patient given exercises, stretches and lifestyle modifications  See medications in patient instructions if given  Patient will follow up in 3 weeks

## 2014-03-25 NOTE — Assessment & Plan Note (Signed)
Patient did respond very well to osteopathic manipulation again today. Patient is concerned that there is possible nerve root impingement. I am not as worried. Discussed with patient though if she would like x-rays these were ordered today. Patient will also be started on gabapentin because this can wake her up at night. Discussed doing the postural exercises on a more regular basis. We discussed icing protocol. We discussed changing her activities including lifting. Patient will make these changes and come back and see me again in 3-4 weeks for further evaluation and treatment. Differential also includes cervical radiculopathy but I think this is highly likely.  Spent greater than 25 minutes with patient face-to-face and had greater than 50% of counseling including as described above in assessment and plan.

## 2014-06-02 ENCOUNTER — Telehealth: Payer: Self-pay | Admitting: Internal Medicine

## 2014-06-02 NOTE — Telephone Encounter (Signed)
Patient said the pain is back in her right shoulder.  She would like to know if there is something she could do to help her pain because she is out of town.

## 2014-06-02 NOTE — Telephone Encounter (Signed)
Ask if taking meloxicam if so that is good.  If not tell her to start or do Ibuprofen 600mg  3 times a day for 3 days Ice 20 minutes 2 times daily. Usually after activity and before bed. Increase gabapentin to 200mg  at night.

## 2014-06-02 NOTE — Telephone Encounter (Signed)
Discussed with pt

## 2014-06-03 ENCOUNTER — Other Ambulatory Visit: Payer: Self-pay | Admitting: Family Medicine

## 2014-06-03 NOTE — Telephone Encounter (Signed)
Refill done.  

## 2014-07-31 ENCOUNTER — Encounter: Payer: Self-pay | Admitting: Family

## 2014-07-31 ENCOUNTER — Ambulatory Visit (INDEPENDENT_AMBULATORY_CARE_PROVIDER_SITE_OTHER): Payer: BC Managed Care – PPO | Admitting: Family

## 2014-07-31 VITALS — BP 122/84 | HR 58 | Temp 98.2°F | Resp 18 | Ht 64.0 in | Wt 165.4 lb

## 2014-07-31 DIAGNOSIS — M62830 Muscle spasm of back: Secondary | ICD-10-CM | POA: Diagnosis not present

## 2014-07-31 MED ORDER — NAPROXEN 500 MG PO TABS: 500.0000 mg | ORAL_TABLET | Freq: Two times a day (BID) | ORAL | Status: DC

## 2014-07-31 MED ORDER — METHOCARBAMOL 500 MG PO TABS: 500.0000 mg | ORAL_TABLET | Freq: Three times a day (TID) | ORAL | Status: DC | PRN

## 2014-07-31 NOTE — Progress Notes (Signed)
   Subjective:    Patient ID: [name], female    DOB: 20-Aug-1960, 54 y.o.   MRN: [id number]  Chief Complaint  Patient presents with  . Back Pain    having pain in the left side of her back, sharp pain, hurts to breathe in, says on a scale it is a 20, came on suddenly yesterday, laying down hurts it as well    HPI:  [name] is a 54 y.o. female with a PMH of dysphagia, hair loss, hyperlipidemia, fatigue, arthralgia, and low back pain who presents today for an acute office visit.  Associated symptom of pain located on the left side of her mid/upper back has been going on for about 1 day following work. She did do Zumba the day before. Indicates that it hurts when laying down, when pressure is applied to it, and when breathing. Described sharp and stabbing with a severity of 20/10. Modifying factors include ice and Bayer aspirin which did not help. Denies any radiating pain. Denies trauma to the area.   Allergies  Allergen Reactions  . Tramadol Nausea Only  . Codeine     REACTION: GI UPSET    Current Outpatient Prescriptions on File Prior to Visit  Medication Sig Dispense Refill  . cetirizine (ZYRTEC) 10 MG tablet Take 10 mg by mouth daily as needed for allergies.    . diphenhydrAMINE (BENADRYL) 25 MG tablet Take 25 mg by mouth as needed.     No current facility-administered medications on file prior to visit.    Review of Systems  Musculoskeletal: Positive for back pain (midback).  Neurological: Negative for numbness.      Objective:    BP 122/84 mmHg  Pulse 58  Temp(Src) 98.2 F (36.8 C) (Oral)  Resp 18  Ht 5\' 4"  (1.626 m)  Wt 165 lb 6.4 oz (75.025 kg)  BMI 28.38 kg/m2  SpO2 99%  LMP 03/27/2001 Nursing note and vital signs reviewed.  Physical Exam  Constitutional: She is oriented to person, place, and time. She appears well-developed and well-nourished. No distress.  Cardiovascular: Normal rate, regular rhythm, normal heart sounds and intact distal  pulses.   Pulmonary/Chest: Effort normal and breath sounds normal.  Musculoskeletal:  No obvious deformity, discoloration, or edema of the thoracic or lumbar spine noted. Mild muscle spasm of left paraspinal musculature noted. Unable to elicit tenderness. Patient displays full range of motion. Shoulder motions are intact and appropriate. Patient able to don jacket on and off without any problems.  Neurological: She is alert and oriented to person, place, and time.  Skin: Skin is warm and dry.  Psychiatric: She has a normal mood and affect. Her behavior is normal. Judgment and thought content normal.       Assessment & Plan:

## 2014-07-31 NOTE — Progress Notes (Signed)
Pre visit review using our clinic review tool, if applicable. No additional management support is needed unless otherwise documented below in the visit note. 

## 2014-07-31 NOTE — Assessment & Plan Note (Signed)
Symptoms and exam consistent with muscle spasm. Start Robaxin as needed for spasm. Start naproxen as needed for inflammation and pain. Recommend conservative treatment with ice and stretching. Follow-up if symptoms worsen or fail to improve.

## 2014-07-31 NOTE — Patient Instructions (Signed)
Thank you for choosing [facility].  Summary/Instructions:  Please heat/ice and stretch multiple times per day.   Your prescription(s) have been submitted to your pharmacy or been printed and provided for you. Please take as directed and contact our office if you believe you are having problem(s) with the medication(s) or have any questions.  If your symptoms worsen or fail to improve, please contact our office for further instruction, or in case of emergency go directly to the emergency room at the closest medical facility.

## 2014-11-20 ENCOUNTER — Ambulatory Visit (INDEPENDENT_AMBULATORY_CARE_PROVIDER_SITE_OTHER): Payer: BC Managed Care – PPO | Admitting: Medical

## 2014-11-20 ENCOUNTER — Encounter: Payer: Self-pay | Admitting: Medical

## 2014-11-20 VITALS — BP 104/68 | HR 89 | Temp 98.3°F | Resp 16 | Ht 64.0 in | Wt 163.8 lb

## 2014-11-20 DIAGNOSIS — J3089 Other allergic rhinitis: Secondary | ICD-10-CM

## 2014-11-20 DIAGNOSIS — Q828 Other specified congenital malformations of skin: Secondary | ICD-10-CM | POA: Diagnosis not present

## 2014-11-20 DIAGNOSIS — J0100 Acute maxillary sinusitis, unspecified: Secondary | ICD-10-CM

## 2014-11-20 MED ORDER — AZITHROMYCIN 250 MG PO TABS: ORAL_TABLET | ORAL | Status: DC

## 2014-11-20 NOTE — Progress Notes (Signed)
Pre visit review using our clinic review tool, if applicable. No additional management support is needed unless otherwise documented below in the visit note. 

## 2014-11-20 NOTE — Progress Notes (Signed)
Subjective:    Patient ID: [name], female    DOB: [birth year], 54 y.o.   MRN: [id number]  HPI  Pt in with some frontal sinus region pain and some maxillary sinus region. She feels like fluid in sinus. Upper teeth sensitive mild. She feels mild congested. Some mild sneeze and pnd. Pt takes zyrtec every day.   Lower lids mild puffy.  Pt uses flonase about 2 times a week.    Review of Systems  Constitutional: Negative for fever, chills and fatigue.  HENT: Positive for congestion, postnasal drip, sinus pressure and sneezing. Negative for ear pain.   Respiratory: Negative for cough, chest tightness, shortness of breath and wheezing.   Cardiovascular: Negative for chest pain and palpitations.  Musculoskeletal: Negative for back pain.  Neurological: Negative for dizziness, light-headedness and headaches.       Does not describe ha. More sinus pressure description.  Hematological: Negative for adenopathy. Does not bruise/bleed easily.  Psychiatric/Behavioral: Negative for behavioral problems and confusion.    Past Medical History  Diagnosis Date  . Anemia     NOS  . Allergy     ALSO DRY EYES  . Hyperlipidemia   . History of colonic polyps   . Hyperglycemia   . 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 Melvyn Novas  . Villous adenoma 1997    Social History   Social History  . Marital Status: Single    Spouse Name: N/A  . Number of Children: N/A  . Years of Education: N/A   Occupational History  . teacher    Social History Main Topics  . Smoking status: Former Smoker    Quit date: 03/27/2001  . Smokeless tobacco: Not on file  . Alcohol Use: No  . Drug Use: Not on file  . Sexual Activity: Not on file   Other Topics Concern  . Not on file   Social History Narrative   Gets regular exercise          Past Surgical History  Procedure Laterality Date  . Abdominal hysterectomy      &USO for fibroid & servere anemia  . Fetal blood transfusion  03/2001     pre tah  . Tonsillectomy    . Partial colectomy  1997    for "precancerous "polyps , Dr Benjamine Sprague, [facility]  . Colonoscopy      Family History  Problem Relation Age of Onset  . Colon cancer Father 65  . Hyperlipidemia Brother   . Colon cancer Maternal Aunt   . Diabetes Paternal Uncle   . Asthma Maternal Grandmother   . Leukemia Father     Allergies  Allergen Reactions  . Tramadol Nausea Only  . Codeine     REACTION: GI UPSET    Current Outpatient Prescriptions on File Prior to Visit  Medication Sig Dispense Refill  . cetirizine (ZYRTEC) 10 MG tablet Take 10 mg by mouth daily as needed for allergies.    . diphenhydrAMINE (BENADRYL) 25 MG tablet Take 25 mg by mouth as needed.     No current facility-administered medications on file prior to visit.    BP 104/68 mmHg  Pulse 89  Temp(Src) 98.3 F (36.8 C) (Oral)  Resp 16  Ht 5\' 4"  (1.626 m)  Wt 163 lb 12.8 oz (74.299 kg)  BMI 28.10 kg/m2  SpO2 98%  LMP 03/27/2001       Objective:   Physical Exam  General  Mental Status - Alert. General Appearance -  Well groomed. Not in acute distress.  Skin Skin tags moderate scattered on her neck.  HEENT Head- Normal. Ear Auditory Canal - Left- Normal. Right - Normal.Tympanic Membrane- Left- dull and mild red. Right-dull and mild red. Eye Sclera/Conjunctiva- Left- Normal. Right- Normal. Nose & Sinuses Nasal Mucosa- Left-  Boggy and Congested. Right-  Boggy and  Congested.Bilateral maxillary and frontal sinus pressure. Mouth & Throat Lips: Upper Lip- Normal: no dryness, cracking, pallor, cyanosis, or vesicular eruption. Lower Lip-Normal: no dryness, cracking, pallor, cyanosis or vesicular eruption. Buccal Mucosa- Bilateral- No Aphthous ulcers. Oropharynx- No Discharge or Erythema. +pnd Tonsils: Characteristics- Bilateral- No Erythema or Congestion. Size/Enlargement- Bilateral- No enlargement. Discharge- bilateral-None.  Neck Neck- Supple. No Masses.   Chest and  Lung Exam Auscultation: Breath Sounds:-Clear even and unlabored.  Cardiovascular Auscultation:Rythm- Regular, rate and rhythm. Murmurs & Other Heart Sounds:Ausculatation of the heart reveal- No Murmurs.  Lymphatic Head & Neck General Head & Neck Lymphatics: Bilateral: Description- No Localized lymphadenopathy.       Assessment & Plan:  For allergies- continue the zyrtec and take flonase daily for at least a week.   You also appear to have sinus infection following congestion from allergies. Rx of azithromycin  Follow up 7 days or as needed.   Skin tags. Last year various out break on her neck. Mentioned at end of the exam. Will refer.

## 2014-11-20 NOTE — Patient Instructions (Addendum)
For allergies- continue the zyrtec and take flonase daily for at least a week.   You also appear to have sinus infection following congestion from allergies. Rx of azithromycin  Follow up 7 days or as needed.   Skin tags. Last year various out break on her neck. Mentioned at end of the exam. Will refer.

## 2015-03-16 ENCOUNTER — Telehealth: Payer: Self-pay | Admitting: Family Medicine

## 2015-03-16 NOTE — Telephone Encounter (Signed)
[facility] - Client TELEPHONE ADVICE RECORD   [facility]     Patient Name: [name] Initial Comment Caller states having pain in upper right back behind breast area-behind and at the bottom of shoulder blade; began yesterday; also dizziness off/on;   DOB: 18-Sep-1960      Nurse Assessment  Nurse: Luther Parody, RN, Cheryl Date/Time (Eastern Time): 03/16/2015 11:12:32 AM  Confirm and document reason for call. If symptomatic, describe symptoms. ---Caller states that she has been having upper right back/shoulder pain for the last 2 days with intermittent indigestion. Today she has also had intermittent dizziness and felt at one point like she would pass out.  Has the patient traveled out of the country within the last 30 days? ---Not Applicable  Does the patient have any new or worsening symptoms? ---Yes  Will a triage be completed? ---Yes  Related visit to physician within the last 2 weeks? ---No  Does the PT have any chronic conditions? (i.e. diabetes, asthma, etc.) ---Yes  List chronic conditions. ---hypoglycemia  Did the patient indicate they were pregnant? ---No  Is this a behavioral health or substance abuse call? ---No    Guidelines     Guideline Title Affirmed Question Affirmed Notes   Shoulder Pain [1] Age > 40 AND [2] no obvious cause AND [3] pain even when not moving the arm (Exception: pain is clearly made worse by moving arm or bending neck)    Final Disposition User   Go to ED Now Luther Parody, RN, Cheryl     Referrals   GO TO FACILITY OTHER - SPECIFY   Disagree/Comply: Comply

## 2015-03-18 ENCOUNTER — Telehealth: Payer: Self-pay

## 2015-03-18 NOTE — Telephone Encounter (Signed)
Follow up called made to patient.Checking on status from [facility]. Patient states, she was diagnosed witn Costchondritis. Has appoint men is schedulrf.

## 2015-03-26 ENCOUNTER — Other Ambulatory Visit: Payer: Self-pay | Admitting: Family Medicine

## 2015-03-26 NOTE — Telephone Encounter (Signed)
Patient was seen by Percell Miller on 11/20/14 and medication was removed from the list. Please advise if this refill is appropriate     KP

## 2015-04-02 ENCOUNTER — Ambulatory Visit (INDEPENDENT_AMBULATORY_CARE_PROVIDER_SITE_OTHER): Payer: BC Managed Care – PPO | Admitting: Family Medicine

## 2015-04-02 ENCOUNTER — Encounter: Payer: Self-pay | Admitting: Family Medicine

## 2015-04-02 VITALS — BP 119/77 | HR 79 | Temp 98.1°F | Wt 172.0 lb

## 2015-04-02 DIAGNOSIS — R1013 Epigastric pain: Secondary | ICD-10-CM | POA: Diagnosis not present

## 2015-04-02 DIAGNOSIS — M940 Chondrocostal junction syndrome [Tietze]: Secondary | ICD-10-CM

## 2015-04-02 DIAGNOSIS — F410 Panic disorder [episodic paroxysmal anxiety] without agoraphobia: Secondary | ICD-10-CM | POA: Diagnosis not present

## 2015-04-02 MED ORDER — OMEPRAZOLE 20 MG PO CPDR: 20.0000 mg | DELAYED_RELEASE_CAPSULE | Freq: Every day | ORAL | Status: DC

## 2015-04-02 MED ORDER — ALPRAZOLAM 0.25 MG PO TABS: 0.2500 mg | ORAL_TABLET | Freq: Every evening | ORAL | Status: DC | PRN

## 2015-04-02 MED ORDER — CELECOXIB 200 MG PO CAPS: 200.0000 mg | ORAL_CAPSULE | Freq: Every day | ORAL | Status: DC

## 2015-04-02 NOTE — Patient Instructions (Signed)

## 2015-04-02 NOTE — Assessment & Plan Note (Signed)
celebrex sent to pharmacy F/u if no improvement

## 2015-04-02 NOTE — Progress Notes (Signed)
Pre visit review using our clinic review tool, if applicable. No additional management support is needed unless otherwise documented below in the visit note. 

## 2015-04-02 NOTE — Progress Notes (Signed)
Patient ID: WYLIE GRISCOM, female    DOB: 07-Oct-1960  Age: 55 y.o. MRN: [id number]    Subjective:  Subjective HPI [name] presents for f/u from [facility] 12/20 for costochondritis.  She was given celebrex but she could not get it filled.  She has been taking aleve and gerd has been worse.  Pain is improving but still hurts.  Pt is also c/o inc anxiety and panic attacks.    Review of Systems  Constitutional: Negative for diaphoresis, appetite change, fatigue and unexpected weight change.  Eyes: Negative for pain, redness and visual disturbance.  Respiratory: Negative for cough, chest tightness, shortness of breath and wheezing.   Cardiovascular: Negative for chest pain, palpitations and leg swelling.  Endocrine: Negative for cold intolerance, heat intolerance, polydipsia, polyphagia and polyuria.  Genitourinary: Negative for dysuria, frequency and difficulty urinating.  Musculoskeletal: Negative for back pain.       Rib pain on right  Neurological: Negative for dizziness, light-headedness, numbness and headaches.    History Past Medical History  Diagnosis Date  . Anemia     NOS  . Allergy     ALSO DRY EYES  . Hyperlipidemia   . History of colonic polyps   . Hyperglycemia   . 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 Melvyn Novas  . Villous adenoma 1997    She has past surgical history that includes Abdominal hysterectomy; Fetal blood transfusion (03/2001); Tonsillectomy; Partial colectomy (1997); and Colonoscopy.   Her family history includes Asthma in her maternal grandmother; Colon cancer in her maternal aunt; Colon cancer (age of onset: 72) in her father; Diabetes in her paternal uncle; Hyperlipidemia in her brother; Leukemia in her father.She reports that she quit smoking about 14 years ago. She does not have any smokeless tobacco history on file. She reports that she does not drink alcohol. Her drug history is not on file.  Current Outpatient Prescriptions on File Prior  to Visit  Medication Sig Dispense Refill  . cetirizine (ZYRTEC) 10 MG tablet Take 10 mg by mouth daily as needed for allergies.    . diphenhydrAMINE (BENADRYL) 25 MG tablet Take 25 mg by mouth as needed.    . fluticasone (FLONASE) 50 MCG/ACT nasal spray USE TWO SPRAY(S) IN EACH NOSTRIL ONCE DAILY 5 g 0   No current facility-administered medications on file prior to visit.     Objective:  Objective Physical Exam  Constitutional: She is oriented to person, place, and time. She appears well-developed and well-nourished.  HENT:  Head: Normocephalic and atraumatic.  Eyes: Conjunctivae and EOM are normal.  Neck: Normal range of motion. Neck supple. No JVD present. Carotid bruit is not present. No thyromegaly present.  Cardiovascular: Normal rate, regular rhythm and normal heart sounds.   No murmur heard. Pulmonary/Chest: Effort normal and breath sounds normal. No respiratory distress. She has no wheezes. She has no rales. She exhibits no tenderness.  Musculoskeletal: She exhibits tenderness. She exhibits no edema.       Arms: Neurological: She is alert and oriented to person, place, and time.  Psychiatric: She has a normal mood and affect.  Nursing note and vitals reviewed.  BP 119/77 mmHg  Pulse 79  Temp(Src) 98.1 F (36.7 C) (Oral)  Wt 172 lb (78.019 kg)  SpO2 99%  LMP 03/27/2001 Wt Readings from Last 3 Encounters:  04/02/15 172 lb (78.019 kg)  11/20/14 163 lb 12.8 oz (74.299 kg)  07/31/14 165 lb 6.4 oz (75.025 kg)  Lab Results  Component Value Date   WBC 3.8* 12/08/2008   HGB 14.6 12/08/2008   HCT 43.0 12/08/2008   PLT 189.0 12/08/2008   GLUCOSE 77 12/08/2008   CHOL 237* 12/08/2008   TRIG 59.0 12/08/2008   HDL 78.20 12/08/2008   LDLDIRECT 145.6 12/08/2008   ALT 17 12/08/2008   AST 22 12/08/2008   NA 141 12/08/2008   K 4.1 04/07/2013   CL 105 12/08/2008   CREATININE 1.0 01/21/2009   BUN 19 01/21/2009   CO2 31 12/08/2008   TSH 0.79 03/29/2010    Dg Lumbar  Spine Complete  12/25/2011  *RADIOLOGY REPORT* Clinical Data: Low back and lower extremity pain, left greater than right. LUMBAR SPINE - COMPLETE 4+ VIEW Comparison: 07/02/2005 Findings: There is no evidence of lumbar spine fracture.  Alignment is normal.  Intervertebral disc spaces are maintained. IMPRESSION: Negative. Original Report Authenticated By: Trecia Rogers, M.D.   Dg Hip Complete Right  12/25/2011  *RADIOLOGY REPORT* Clinical Data: Hip pain RIGHT HIP - COMPLETE 2+ VIEW Comparison: None. Findings: Hips are located.  No evidence of pelvic fracture sacral fracture.  Dedicated view of the right hip demonstrates no hip fracture. IMPRESSION: No pelvic fracture or hip fracture. No significant arthropathy evident. Original Report Authenticated By: Suzy Bouchard, M.D.     Assessment & Plan:  Plan I have discontinued [name]'s azithromycin. I am also having her start on celecoxib, omeprazole, and ALPRAZolam. Additionally, I am having her maintain her diphenhydrAMINE, cetirizine, and fluticasone.  Meds ordered this encounter  Medications  . celecoxib (CELEBREX) 200 MG capsule    Sig: Take 1 capsule (200 mg total) by mouth daily.    Dispense:  30 capsule    Refill:  1  . omeprazole (PRILOSEC) 20 MG capsule    Sig: Take 1 capsule (20 mg total) by mouth daily.    Dispense:  30 capsule    Refill:  3  . ALPRAZolam (XANAX) 0.25 MG tablet    Sig: Take 1 tablet (0.25 mg total) by mouth at bedtime as needed for anxiety.    Dispense:  30 tablet    Refill:  0    Problem List Items Addressed This Visit    Costochondritis - Primary    celebrex sent to pharmacy F/u if no improvement      Relevant Medications   celecoxib (CELEBREX) 200 MG capsule    Other Visit Diagnoses    Dyspepsia        Relevant Medications    omeprazole (PRILOSEC) 20 MG capsule    Panic attack        Relevant Medications    ALPRAZolam (XANAX) 0.25 MG tablet       Follow-up: Return if symptoms worsen  or fail to improve.  Garnet Koyanagi, DO

## 2015-05-21 ENCOUNTER — Other Ambulatory Visit: Payer: Self-pay | Admitting: Family Medicine

## 2015-05-24 NOTE — Telephone Encounter (Signed)
Last seen and filled 04/02/15 #30 No UDS   Please advise    KP

## 2015-05-25 ENCOUNTER — Ambulatory Visit (INDEPENDENT_AMBULATORY_CARE_PROVIDER_SITE_OTHER): Payer: BC Managed Care – PPO | Admitting: Physician Assistant

## 2015-05-25 ENCOUNTER — Encounter: Payer: Self-pay | Admitting: Physician Assistant

## 2015-05-25 VITALS — BP 112/72 | HR 67 | Temp 98.8°F | Ht 65.0 in | Wt 167.4 lb

## 2015-05-25 DIAGNOSIS — L989 Disorder of the skin and subcutaneous tissue, unspecified: Secondary | ICD-10-CM

## 2015-05-25 NOTE — Patient Instructions (Signed)
Please keep skin clean and dry. Apply Sarna lotion (over-the-counter) if needed for itch. Avoid applying makeup to the area.  You will be contacted for an appointment with Dermatology.

## 2015-05-25 NOTE — Progress Notes (Signed)
Pre visit review using our clinic review tool, if applicable. No additional management support is needed unless otherwise documented below in the visit note. 

## 2015-05-25 NOTE — Assessment & Plan Note (Signed)
Unclear etiology. Does not seem consistent with dermatitis, malignancy or inflammatory lesions. Asymptomatic. Referral placed to Dermatology for assessment and management.

## 2015-05-25 NOTE — Progress Notes (Signed)
    Patient presents to clinic today c/o small "bumps" of neck and face that have been present for several weeks. Denies pain or pruritus. Denies lesion elsewhere. Denies sick contact with similar lesions. Denies personal history of seasonal lesions. Denies change to soaps, lotions, detergents or hygiene products.   Past Medical History  Diagnosis Date  . Anemia     NOS  . Allergy     ALSO DRY EYES  . Hyperlipidemia   . History of colonic polyps   . Hyperglycemia   . 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 Melvyn Novas  . Villous adenoma 1997    Current Outpatient Prescriptions on File Prior to Visit  Medication Sig Dispense Refill  . ALPRAZolam (XANAX) 0.25 MG tablet TAKE ONE TABLET BY MOUTH ONCE DAILY AT BEDTIME AS NEEDED FOR ANXIETY 30 tablet 0  . cetirizine (ZYRTEC) 10 MG tablet Take 10 mg by mouth daily as needed for allergies.    . diphenhydrAMINE (BENADRYL) 25 MG tablet Take 25 mg by mouth as needed.    . fluticasone (FLONASE) 50 MCG/ACT nasal spray USE TWO SPRAY(S) IN EACH NOSTRIL ONCE DAILY 5 g 0  . celecoxib (CELEBREX) 200 MG capsule Take 1 capsule (200 mg total) by mouth daily. (Patient not taking: Reported on 05/25/2015) 30 capsule 1   No current facility-administered medications on file prior to visit.    Allergies  Allergen Reactions  . Tramadol Nausea Only  . Codeine     REACTION: GI UPSET    Family History  Problem Relation Age of Onset  . Colon cancer Father 56  . Hyperlipidemia Brother   . Colon cancer Maternal Aunt   . Diabetes Paternal Uncle   . Asthma Maternal Grandmother   . Leukemia Father     Social History   Social History  . Marital Status: Single    Spouse Name: N/A  . Number of Children: N/A  . Years of Education: N/A   Occupational History  . teacher    Social History Main Topics  . Smoking status: Former Smoker    Quit date: 03/27/2001  . Smokeless tobacco: None  . Alcohol Use: No  . Drug Use: None  . Sexual Activity:  Not Asked   Other Topics Concern  . None   Social History Narrative   Gets regular exercise         Review of Systems - See HPI.  All other ROS are negative.  BP 112/72 mmHg  Pulse 67  Temp(Src) 98.8 F (37.1 C) (Oral)  Ht 5\' 5"  (1.651 m)  Wt 167 lb 6 oz (75.921 kg)  BMI 27.85 kg/m2  SpO2 97%  LMP 03/27/2001  Physical Exam  Constitutional: She is oriented to person, place, and time and well-developed, well-nourished, and in no distress.  HENT:  Head: Normocephalic and atraumatic.  Cardiovascular: Normal rate, regular rhythm, normal heart sounds and intact distal pulses.   Pulmonary/Chest: Effort normal and breath sounds normal. No respiratory distress. She has no wheezes. She has no rales. She exhibits no tenderness.  Neurological: She is alert and oriented to person, place, and time.  Skin:     Vitals reviewed.   No results found for this or any previous visit (from the past 2160 hour(s)).  Assessment/Plan: Skin lesions Unclear etiology. Does not seem consistent with dermatitis, malignancy or inflammatory lesions. Asymptomatic. Referral placed to Dermatology for assessment and management.

## 2015-07-06 ENCOUNTER — Ambulatory Visit: Payer: BC Managed Care – PPO | Admitting: Family Medicine

## 2015-07-18 ENCOUNTER — Other Ambulatory Visit: Payer: Self-pay | Admitting: Family Medicine

## 2015-07-19 NOTE — Telephone Encounter (Signed)
Last seen 04/02/15 and filled 05/24/15 #30   Please advise     KP

## 2015-08-31 ENCOUNTER — Other Ambulatory Visit: Payer: Self-pay | Admitting: Family Medicine

## 2015-09-01 ENCOUNTER — Other Ambulatory Visit: Payer: Self-pay | Admitting: Family Medicine

## 2015-09-01 NOTE — Telephone Encounter (Signed)
Last seen 04/02/15 and filled 07/19/15 #30   Please advise   KP

## 2015-09-02 MED ORDER — ALPRAZOLAM 0.25 MG PO TABS: ORAL_TABLET | ORAL | Status: DC

## 2015-09-02 NOTE — Addendum Note (Signed)
Addended by: Ewing Schlein on: 09/02/2015 08:31 AM   Modules accepted: Orders

## 2015-09-24 ENCOUNTER — Telehealth: Payer: Self-pay | Admitting: Family Medicine

## 2015-09-24 NOTE — Telephone Encounter (Signed)
Left message @ [phone] informing patient that her appointment on 10/15/2015 with Dr. [name] has been cancelled and needs to be rescheduled.

## 2015-10-06 ENCOUNTER — Other Ambulatory Visit: Payer: Self-pay | Admitting: Family Medicine

## 2015-10-07 NOTE — Telephone Encounter (Signed)
Last seen 04/02/15 and filled 09/02/15 #30 No UDS and No contract  Please advise    KP

## 2015-10-15 ENCOUNTER — Encounter: Payer: Self-pay | Admitting: Family Medicine

## 2015-11-02 ENCOUNTER — Ambulatory Visit (INDEPENDENT_AMBULATORY_CARE_PROVIDER_SITE_OTHER): Payer: BC Managed Care – PPO | Admitting: Family Medicine

## 2015-11-02 VITALS — BP 120/70 | HR 72 | Temp 98.8°F | Wt 165.0 lb

## 2015-11-02 DIAGNOSIS — M7660 Achilles tendinitis, unspecified leg: Secondary | ICD-10-CM | POA: Diagnosis not present

## 2015-11-02 NOTE — Progress Notes (Signed)
Patient ID: [name], female    DOB: [birth year]  Age: 56 y.o. MRN: [id number]    Subjective:  Subjective  HPI [name] presents for r foot pain / heel pain and low back pain that radiates down her right leg.  The back has been hurting for years but her foot started hurting after walking around the [place] end of July.  It has not improved and is getting worse.    Review of Systems  Constitutional: Negative for appetite change, diaphoresis, fatigue and unexpected weight change.  Eyes: Negative for pain, redness and visual disturbance.  Respiratory: Negative for cough, chest tightness, shortness of breath and wheezing.   Cardiovascular: Negative for chest pain, palpitations and leg swelling.  Endocrine: Negative for cold intolerance, heat intolerance, polydipsia, polyphagia and polyuria.  Genitourinary: Negative for difficulty urinating, dysuria and frequency.  Musculoskeletal: Positive for back pain and gait problem.  Neurological: Negative for dizziness, light-headedness, numbness and headaches.    History Past Medical History:  Diagnosis Date  . Allergy    ALSO DRY EYES  . Anemia    NOS  . 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 Melvyn Novas  . History of colonic polyps   . Hyperglycemia   . Hyperlipidemia   . Villous adenoma 1997    She has a past surgical history that includes Abdominal hysterectomy; Fetal blood transfusion (03/2001); Tonsillectomy; Partial colectomy (1997); and Colonoscopy.   Her family history includes Asthma in her maternal grandmother; Colon cancer in her maternal aunt; Colon cancer (age of onset: 17) in her father; Diabetes in her paternal uncle; Hyperlipidemia in her brother; Leukemia in her father.She reports that she quit smoking about 14 years ago. She does not have any smokeless tobacco history on file. She reports that she does not drink alcohol. Her drug history is not on file.  Current Outpatient Prescriptions on File Prior  to Visit  Medication Sig Dispense Refill  . ALPRAZolam (XANAX) 0.25 MG tablet TAKE ONE TABLET BY MOUTH ONCE DAILY AT BEDTIME AS NEEDED FOR ANXIETY 30 tablet 0  . cetirizine (ZYRTEC) 10 MG tablet Take 10 mg by mouth daily as needed for allergies.    . diphenhydrAMINE (BENADRYL) 25 MG tablet Take 25 mg by mouth as needed.    . fluticasone (FLONASE) 50 MCG/ACT nasal spray USE TWO SPRAY(S) IN EACH NOSTRIL ONCE DAILY 5 g 0   No current facility-administered medications on file prior to visit.      Objective:  Objective  Physical Exam  Musculoskeletal:       Lumbar back: She exhibits tenderness.       Back:       Right lower leg: She exhibits tenderness. She exhibits no swelling, no edema and no deformity.       Feet:   BP 120/70 (BP Location: Left Arm, Patient Position: Sitting, Cuff Size: Normal)   Pulse 72   Temp 98.8 F (37.1 C) (Oral)   Wt 165 lb (74.8 kg)   LMP 03/27/2001   SpO2 98%   BMI 27.46 kg/m  Wt Readings from Last 3 Encounters:  11/02/15 165 lb (74.8 kg)  05/25/15 167 lb 6 oz (75.9 kg)  04/02/15 172 lb (78 kg)     Lab Results  Component Value Date   WBC 3.8 (L) 12/08/2008   HGB 14.6 12/08/2008   HCT 43.0 12/08/2008   PLT 189.0 12/08/2008   GLUCOSE 77 12/08/2008   CHOL 237 (H) 12/08/2008   TRIG  59.0 12/08/2008   HDL 78.20 12/08/2008   LDLDIRECT 145.6 12/08/2008   ALT 17 12/08/2008   AST 22 12/08/2008   NA 141 12/08/2008   K 4.1 04/07/2013   CL 105 12/08/2008   CREATININE 1.0 01/21/2009   BUN 19 01/21/2009   CO2 31 12/08/2008   TSH 0.79 03/29/2010    Dg Lumbar Spine Complete  Result Date: 12/25/2011 *RADIOLOGY REPORT* Clinical Data: Low back and lower extremity pain, left greater than right. LUMBAR SPINE - COMPLETE 4+ VIEW Comparison: 07/02/2005 Findings: There is no evidence of lumbar spine fracture.  Alignment is normal.  Intervertebral disc spaces are maintained. IMPRESSION: Negative. Original Report Authenticated By: Trecia Rogers, M.D.     Dg Hip Complete Right  Result Date: 12/25/2011 *RADIOLOGY REPORT* Clinical Data: Hip pain RIGHT HIP - COMPLETE 2+ VIEW Comparison: None. Findings: Hips are located.  No evidence of pelvic fracture sacral fracture.  Dedicated view of the right hip demonstrates no hip fracture. IMPRESSION: No pelvic fracture or hip fracture. No significant arthropathy evident. Original Report Authenticated By: Suzy Bouchard, M.D.     Assessment & Plan:  Plan  I have discontinued [name]'s celecoxib. I am also having her maintain her diphenhydrAMINE, cetirizine, fluticasone, and ALPRAZolam.  No orders of the defined types were placed in this encounter.   Problem List Items Addressed This Visit    None    Visit Diagnoses    Achilles tendinitis, unspecified laterality    -  Primary    nsaids, rest, ice, elevation Pt already established with [facility]-- she will f/u with them  Follow-up: No Follow-up on file.  Ann Held, DO

## 2015-11-02 NOTE — Patient Instructions (Signed)
Achilles Tendinitis Achilles tendinitis is inflammation of the tough, cord-like band that attaches the lower muscles of your leg to your heel (Achilles tendon). It is usually caused by overusing the tendon and joint involved.  CAUSES Achilles tendinitis can happen because of:  A sudden increase in exercise or activity (such as running).  Doing the same exercises or activities (such as jumping) over and over.  Not warming up calf muscles before exercising.  Exercising in shoes that are worn out or not made for exercise.  Having arthritis or a bone growth on the back of the heel bone. This can rub against the tendon and hurt the tendon. SIGNS AND SYMPTOMS The most common symptoms are:  Pain in the back of the leg, just above the heel. The pain usually gets worse with exercise and better with rest.  Stiffness or soreness in the back of the leg, especially in the morning.  Swelling of the skin over the Achilles tendon.  Trouble standing on tiptoe. Sometimes, an Achilles tendon tears (ruptures). Symptoms of an Achilles tendon rupture can include:  Sudden, severe pain in the back of the leg.  Trouble putting weight on the foot or walking normally. DIAGNOSIS Achilles tendinitis will be diagnosed based on symptoms and a physical examination. An X-ray may be done to check if another condition is causing your symptoms. An MRI may be ordered if your health care provider suspects you may have completely torn your tendon, which is called an Achilles tendon rupture.  TREATMENT  Achilles tendinitis usually gets better over time. It can take weeks to months to heal completely. Treatment focuses on treating the symptoms and helping the injury heal. HOME CARE INSTRUCTIONS   Rest your Achilles tendon and avoid activities that cause pain.  Apply ice to the injured area:  Put ice in a plastic bag.  Place a towel between your skin and the bag.  Leave the ice on for 20 minutes, 2-3 times a  day  Try to avoid using the tendon (other than gentle range of motion) while the tendon is painful. Do not resume use until instructed by your health care provider. Then begin use gradually. Do not increase use to the point of pain. If pain does develop, decrease use and continue the above measures. Gradually increase activities that do not cause discomfort until you achieve normal use.  Do exercises to make your calf muscles stronger and more flexible. Your health care provider or physical therapist can recommend exercises for you to do.  Wrap your ankle with an elastic bandage or other wrap. This can help keep your tendon from moving too much. Your health care provider will show you how to wrap your ankle correctly.  Only take over-the-counter or prescription medicines for pain, discomfort, or fever as directed by your health care provider. SEEK MEDICAL CARE IF:   Your pain and swelling increase or pain is uncontrolled with medicines.  You develop new, unexplained symptoms or your symptoms get worse.  You are unable to move your toes or foot.  You develop warmth and swelling in your foot.  You have an unexplained temperature. MAKE SURE YOU:   Understand these instructions.  Will watch your condition.  Will get help right away if you are not doing well or get worse.   This information is not intended to replace advice given to you by your health care provider. Make sure you discuss any questions you have with your health care provider.   Document Released:   12/21/2004 Document Revised: 04/03/2014 Document Reviewed: 10/23/2012 Elsevier Interactive Patient Education 2016 Elsevier Inc.  

## 2015-11-03 ENCOUNTER — Encounter: Payer: Self-pay | Admitting: Family Medicine

## 2015-12-07 ENCOUNTER — Other Ambulatory Visit: Payer: Self-pay | Admitting: Family Medicine

## 2015-12-07 NOTE — Telephone Encounter (Signed)
Last seen 11/02/15 and filled 10/07/15 #30   Please advise   KP

## 2015-12-08 ENCOUNTER — Ambulatory Visit (INDEPENDENT_AMBULATORY_CARE_PROVIDER_SITE_OTHER): Payer: BC Managed Care – PPO | Admitting: Family Medicine

## 2015-12-08 ENCOUNTER — Other Ambulatory Visit: Payer: Self-pay

## 2015-12-08 ENCOUNTER — Other Ambulatory Visit: Payer: Self-pay | Admitting: Family Medicine

## 2015-12-08 ENCOUNTER — Telehealth: Payer: Self-pay | Admitting: Family Medicine

## 2015-12-08 ENCOUNTER — Encounter: Payer: Self-pay | Admitting: Family Medicine

## 2015-12-08 VITALS — BP 112/76 | HR 75 | Wt 169.0 lb

## 2015-12-08 DIAGNOSIS — M542 Cervicalgia: Secondary | ICD-10-CM | POA: Diagnosis not present

## 2015-12-08 DIAGNOSIS — M79671 Pain in right foot: Secondary | ICD-10-CM

## 2015-12-08 DIAGNOSIS — M7662 Achilles tendinitis, left leg: Secondary | ICD-10-CM | POA: Diagnosis not present

## 2015-12-08 MED ORDER — DICLOFENAC SODIUM 2 % TD SOLN: TRANSDERMAL | 3 refills | Status: DC

## 2015-12-08 NOTE — Telephone Encounter (Signed)
Patient called states that she thinks that she needs a small air cast and she was given medium. She is asking what we need to do to switch them out

## 2015-12-08 NOTE — Assessment & Plan Note (Signed)
Patient does have more of an Achilles tendinitis. We discussed heel lift in the shoe, home exercises and patient work with Product/process development scientist. We discussed which activities to do. Discussed which activities to potentially avoid. Patient given topical anti-inflammatory perception today that I think be beneficial. Patient will come back and see me again in 3-4 weeks. If continuing have pain we'll consider nitroglycerin patches as well as possible referral to formal physical therapy.

## 2015-12-08 NOTE — Progress Notes (Signed)
[name] [place], [place] [address] Phone: [phone] Subjective:    I'm seeing this patient by the request  of:  Ann Held, DO   CC: Right Heel pain.  [id number]  [name] is a 55 y.o. female coming in with complaint of heel pain. Mrs. Right-sided. Patient was found to go on a trip in July. States that when she was doing a lot of hiking she notices some discomfort. Since then it seemed to be slowly getting worse. States that now it is affecting even daily activities such as walking long distances. Patient states that it seems to be on the posterior aspect of the heel. Patient states that there is no significant radiation but has a throbbing on the bottom of the foot at the end of the day. Has not notice any association with any type of shoes. No swelling numbness or weakness. Rates the severity of pain as 5 out of 10 and worsening.    Past Medical History:  Diagnosis Date  . Allergy    ALSO DRY EYES  . Anemia    NOS  . 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 Melvyn Novas  . History of colonic polyps   . Hyperglycemia   . Hyperlipidemia   . Villous adenoma 1997   Past Surgical History:  Procedure Laterality Date  . ABDOMINAL HYSTERECTOMY     &USO for fibroid & servere anemia  . COLONOSCOPY    . FETAL BLOOD TRANSFUSION  03/2001   pre tah  . PARTIAL COLECTOMY  1997   for "precancerous "polyps , Dr Benjamine Sprague, [facility]  . TONSILLECTOMY     Social History   Social History  . Marital status: Single    Spouse name: N/A  . Number of children: N/A  . Years of education: N/A   Occupational History  . teacher [employer]   Social History Main Topics  . Smoking status: Former Smoker    Quit date: 03/27/2001  . Smokeless tobacco: Not on file  . Alcohol use No  . Drug use: Unknown  . Sexual activity: Not on file   Other Topics Concern  . Not on file   Social History Narrative   Gets regular  exercise         Allergies  Allergen Reactions  . Tramadol Nausea Only  . Codeine     REACTION: GI UPSET   Family History  Problem Relation Age of Onset  . Colon cancer Father 21  . Hyperlipidemia Brother   . Colon cancer Maternal Aunt   . Diabetes Paternal Uncle   . Asthma Maternal Grandmother   . Leukemia Father     Past medical history, social, surgical and family history all reviewed in electronic medical record.  No pertanent information unless stated regarding to the chief complaint.   Review of Systems: No headache, visual changes, nausea, vomiting, diarrhea, constipation, dizziness, abdominal pain, skin rash, fevers, chills, night sweats, weight loss, swollen lymph nodes, body aches, joint swelling, muscle aches, chest pain, shortness of breath, mood changes.   Objective  Last menstrual period 03/27/2001.  General: No apparent distress alert and oriented x3 mood and affect normal, dressed appropriately.  HEENT: Pupils equal, extraocular movements intact  Respiratory: Patient's speak in full sentences and does not appear short of breath  Cardiovascular: No lower extremity edema, non tender, no erythema  Skin: Warm dry intact with no signs of infection or rash on  extremities or on axial skeleton.  Abdomen: Soft nontender  Neuro: Cranial nerves II through XII are intact, neurovascularly intact in all extremities with 2+ DTRs and 2+ pulses.  Lymph: No lymphadenopathy of posterior or anterior cervical chain or axillae bilaterally.  Gait normal with good balance and coordination.  MSK:  Non tender with full range of motion and good stability and symmetric strength and tone of shoulders, elbows, wrist, hip, knee bilaterally.  Ankle: Right  No visible erythema or swelling. Range of motion is full in all directions. Strength is 5/5 in all directions. Stable lateral and medial ligaments; squeeze test and kleiger test unremarkable; Talar dome nontender; No pain at base of 5th  MT; No tenderness over cuboid; No tenderness over N spot or navicular prominence No tenderness on posterior aspects of lateral and medial malleolus No sign of peroneal tendon subluxations or tenderness to palpation Negative tarsal tunnel tinel's Tender at the insertion of the Achilles at the calcaneal region. No tenderness over the plantar fascial Able to walk 4 steps.  Contralateral ankle unremarkable  MSK US performed of: Right This study was ordered, performed, and interpreted by Charlann Boxer D.O.  Foot/Ankle:   Achilles tendon at the insertion shows some mild calcific changes. Patient does have hypoechoic changes and mild increasing in Doppler flow. No true tear appreciated. 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     Impression and Recommendations:     This case required medical decision making of moderate complexity.      Note: This dictation was prepared with Dragon dictation along with smaller phrase technology. Any transcriptional errors that result from this process are unintentional.

## 2015-12-08 NOTE — Patient Instructions (Addendum)
Great to see you! Ice bath 10 minutes at night pennsaid pinkie amount topically 2 times daily as needed.  Heel lift in the shoe daily will help Wear brace as well if you think it helps.  Vitamin D 2000 IU dialy  Turmeric 500mg  twice daily for inflammation  See me again in 4 weeks.

## 2015-12-08 NOTE — Assessment & Plan Note (Signed)
Was responding very well to conservative therapy. Has had osteopathic manipulation multiple times.

## 2015-12-09 ENCOUNTER — Other Ambulatory Visit: Payer: Self-pay | Admitting: Family Medicine

## 2015-12-09 NOTE — Telephone Encounter (Signed)
Left msg on work phone.

## 2015-12-09 NOTE — Telephone Encounter (Signed)
Spoke with pt, she will be coming by the office tomorrow to exchange a brace.

## 2015-12-09 NOTE — Telephone Encounter (Signed)
Tried calling pt & it states "this line is not in service"

## 2015-12-10 ENCOUNTER — Encounter: Payer: BC Managed Care – PPO | Admitting: Family Medicine

## 2016-01-19 ENCOUNTER — Other Ambulatory Visit: Payer: Self-pay | Admitting: Family Medicine

## 2016-01-20 NOTE — Telephone Encounter (Signed)
Last seen 11/02/15 and filled 12/07/15 #30   Please advise    KP

## 2016-01-29 ENCOUNTER — Other Ambulatory Visit: Payer: Self-pay | Admitting: Family Medicine

## 2016-01-31 MED ORDER — FLUTICASONE PROPIONATE 50 MCG/ACT NA SUSP: 2.0000 | Freq: Every day | NASAL | 0 refills | Status: DC

## 2016-01-31 NOTE — Telephone Encounter (Signed)
Duplicate request. Tl/CMA

## 2016-02-15 ENCOUNTER — Ambulatory Visit (INDEPENDENT_AMBULATORY_CARE_PROVIDER_SITE_OTHER): Payer: BC Managed Care – PPO | Admitting: Family Medicine

## 2016-02-15 ENCOUNTER — Encounter: Payer: Self-pay | Admitting: Family Medicine

## 2016-02-15 VITALS — BP 120/78 | HR 62 | Temp 97.8°F | Resp 16 | Ht 64.0 in | Wt 171.6 lb

## 2016-02-15 DIAGNOSIS — M62838 Other muscle spasm: Secondary | ICD-10-CM | POA: Diagnosis not present

## 2016-02-15 DIAGNOSIS — R079 Chest pain, unspecified: Secondary | ICD-10-CM | POA: Diagnosis not present

## 2016-02-15 MED ORDER — GI COCKTAIL ~~LOC~~
30.0000 mL | Freq: Once | ORAL | Status: AC
  Administered 2016-02-15: 30 mL via ORAL

## 2016-02-15 MED ORDER — CARISOPRODOL 350 MG PO TABS: 350.0000 mg | ORAL_TABLET | Freq: Four times a day (QID) | ORAL | 0 refills | Status: DC | PRN

## 2016-02-15 NOTE — Patient Instructions (Signed)
Muscle Cramps and Spasms Muscle cramps and spasms are when muscles tighten by themselves. They usually get better within minutes. Muscle cramps are painful. They are usually stronger and last longer than muscle spasms. Muscle spasms may or may not be painful. They can last a few seconds or much longer. HOME CARE  Drink enough fluid to keep your pee (urine) clear or pale yellow.  Massage, stretch, and relax the muscle.  Use a warm towel, heating pad, or warm shower water on tight muscles.  Place ice on the muscle if it is tender or in pain.  Put ice in a plastic bag.  Place a towel between your skin and the bag.  Leave the ice on for 15-20 minutes, 3-4 times a day.  Only take medicine as told by your doctor. GET HELP RIGHT AWAY IF:  Your cramps or spasms get worse, happen more often, or do not get better with time. MAKE SURE YOU:  Understand these instructions.  Will watch your condition.  Will get help right away if you are not doing well or get worse. This information is not intended to replace advice given to you by your health care provider. Make sure you discuss any questions you have with your health care provider. Document Released: 02/24/2008 Document Revised: 07/08/2012 Document Reviewed: 12/15/2014 Elsevier Interactive Patient Education  2017 Elsevier Inc.  

## 2016-02-15 NOTE — Progress Notes (Signed)
Patient ID: [name], female    DOB: 11-Apr-1960  Age: 55 y.o. MRN: [id number]    Subjective:  Subjective  HPI [name] presents for c/o cp with breathing and moving.   No congestion, no cough         }  Review of Systems  Constitutional: Negative for activity change, appetite change, chills, diaphoresis, fatigue, fever and unexpected weight change.  Eyes: Negative for pain, redness and visual disturbance.  Respiratory: Negative for cough, chest tightness, shortness of breath and wheezing.   Cardiovascular: Negative for chest pain, palpitations and leg swelling.  Gastrointestinal: Negative for abdominal distention and abdominal pain.  Endocrine: Negative for cold intolerance, heat intolerance, polydipsia, polyphagia and polyuria.  Genitourinary: Negative for difficulty urinating, dyspareunia, dysuria, flank pain, frequency, genital sores, hematuria, menstrual problem, pelvic pain, urgency, vaginal discharge and vaginal pain.  Musculoskeletal: Positive for back pain, neck pain and neck stiffness.  Neurological: Negative for dizziness, light-headedness, numbness and headaches.    History Past Medical History:  Diagnosis Date  . Allergy    ALSO DRY EYES  . Anemia    NOS  . 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 Melvyn Novas  . History of colonic polyps   . Hyperglycemia   . Hyperlipidemia   . Villous adenoma 1997    She has a past surgical history that includes Abdominal hysterectomy; Fetal blood transfusion (03/2001); Tonsillectomy; Partial colectomy (1997); and Colonoscopy.   Her family history includes Asthma in her maternal grandmother; Colon cancer in her maternal aunt; Colon cancer (age of onset: 63) in her father; Diabetes in her paternal uncle; Hyperlipidemia in her brother; Leukemia in her father.She reports that she quit smoking about 14 years ago. She does not have any smokeless tobacco history on file. She reports that she does not drink alcohol.  Her drug history is not on file.  Current Outpatient Prescriptions on File Prior to Visit  Medication Sig Dispense Refill  . ALPRAZolam (XANAX) 0.25 MG tablet TAKE ONE TABLET BY MOUTH ONCE DAILY AT BEDTIME AS NEEDED FOR ANXIETY 30 tablet 0  . cetirizine (ZYRTEC) 10 MG tablet Take 10 mg by mouth daily as needed for allergies.    . diphenhydrAMINE (BENADRYL) 25 MG tablet Take 25 mg by mouth as needed.    . fluticasone (FLONASE) 50 MCG/ACT nasal spray Place 2 sprays into both nostrils daily. 5 g 0  . Diclofenac Sodium (PENNSAID) 2 % SOLN Apply 1 pump twice daily. (Patient not taking: Reported on 02/15/2016) 112 g 3   No current facility-administered medications on file prior to visit.      Objective:  Objective  Physical Exam  Constitutional: She is oriented to person, place, and time. She appears well-developed and well-nourished.  HENT:  Head: Normocephalic and atraumatic.  Eyes: Conjunctivae and EOM are normal.  Neck: Normal range of motion. Neck supple. No JVD present. Carotid bruit is not present. No thyromegaly present.  Cardiovascular: Normal rate, regular rhythm and normal heart sounds.   No murmur heard. Pulmonary/Chest: Effort normal and breath sounds normal. No respiratory distress. She has no wheezes. She has no rales. She exhibits no tenderness.  Musculoskeletal: She exhibits tenderness. She exhibits no edema.       Arms: Neurological: She is alert and oriented to person, place, and time.  Psychiatric: She has a normal mood and affect.  Nursing note and vitals reviewed.  BP 120/78 (BP Location: Right Arm, Patient Position: Sitting, Cuff Size: Large)   Pulse 62  Temp 97.8 F (36.6 C) (Oral)   Resp 16   Ht 5\' 4"  (1.626 m)   Wt 171 lb 9.6 oz (77.8 kg)   LMP 03/27/2001   SpO2 97%   BMI 29.46 kg/m  Wt Readings from Last 3 Encounters:  02/15/16 171 lb 9.6 oz (77.8 kg)  12/08/15 169 lb (76.7 kg)  11/02/15 165 lb (74.8 kg)     Lab Results  Component Value Date    WBC 3.8 (L) 12/08/2008   HGB 14.6 12/08/2008   HCT 43.0 12/08/2008   PLT 189.0 12/08/2008   GLUCOSE 77 12/08/2008   CHOL 237 (H) 12/08/2008   TRIG 59.0 12/08/2008   HDL 78.20 12/08/2008   LDLDIRECT 145.6 12/08/2008   ALT 17 12/08/2008   AST 22 12/08/2008   NA 141 12/08/2008   K 4.1 04/07/2013   CL 105 12/08/2008   CREATININE 1.0 01/21/2009   BUN 19 01/21/2009   CO2 31 12/08/2008   TSH 0.79 03/29/2010    Dg Lumbar Spine Complete  Result Date: 12/25/2011 *RADIOLOGY REPORT* Clinical Data: Low back and lower extremity pain, left greater than right. LUMBAR SPINE - COMPLETE 4+ VIEW Comparison: 07/02/2005 Findings: There is no evidence of lumbar spine fracture.  Alignment is normal.  Intervertebral disc spaces are maintained. IMPRESSION: Negative. Original Report Authenticated By: Trecia Rogers, M.D.   Dg Hip Complete Right  Result Date: 12/25/2011 *RADIOLOGY REPORT* Clinical Data: Hip pain RIGHT HIP - COMPLETE 2+ VIEW Comparison: None. Findings: Hips are located.  No evidence of pelvic fracture sacral fracture.  Dedicated view of the right hip demonstrates no hip fracture. IMPRESSION: No pelvic fracture or hip fracture. No significant arthropathy evident. Original Report Authenticated By: Suzy Bouchard, M.D.    ekg-- nsr Assessment & Plan:  Plan  I am having Ms. Berens start on carisoprodol. I am also having her maintain her diphenhydrAMINE, cetirizine, Diclofenac Sodium, ALPRAZolam, and fluticasone. We administered gi cocktail.  Meds ordered this encounter  Medications  . gi cocktail (Maalox,Lidocaine,Donnatal)  . carisoprodol (SOMA) 350 MG tablet    Sig: Take 1 tablet (350 mg total) by mouth 4 (four) times daily as needed for muscle spasms.    Dispense:  30 tablet    Refill:  0    Problem List Items Addressed This Visit      Unprioritized   CHEST PAIN - Primary   Relevant Medications   gi cocktail (Maalox,Lidocaine,Donnatal) (Completed)   Other Relevant Orders    EKG 12-Lead (Completed)   DG Chest 2 View   CBC with Differential/Platelet   Comprehensive metabolic panel   H. pylori antibody, IgG    Other Visit Diagnoses    Muscle spasm       Relevant Medications   carisoprodol (SOMA) 350 MG tablet     Advised massage and chiropractor  Gi cocktail given with a little relief-- most pain due to palpation of trap muscle Follow-up: No Follow-up on file.  Ann Held, DO

## 2016-02-15 NOTE — Progress Notes (Signed)
Pre visit review using our clinic review tool, if applicable. No additional management support is needed unless otherwise documented below in the visit note. 

## 2016-03-10 ENCOUNTER — Ambulatory Visit: Payer: Self-pay | Admitting: Family Medicine

## 2016-03-29 NOTE — Progress Notes (Deleted)
[facility] [place], [place] [address] Phone: [phone] Subjective:    I'm seeing this patient by the request  of:    CC: Right-sided rib pain  [id number]  [name] is a 56 y.o. female coming in with complaint of ***     Past Medical History:  Diagnosis Date  . Allergy    ALSO DRY EYES  . Anemia    NOS  . 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 Melvyn Novas  . History of colonic polyps   . Hyperglycemia   . Hyperlipidemia   . Villous adenoma 1997   Past Surgical History:  Procedure Laterality Date  . ABDOMINAL HYSTERECTOMY     &USO for fibroid & servere anemia  . COLONOSCOPY    . FETAL BLOOD TRANSFUSION  03/2001   pre tah  . PARTIAL COLECTOMY  1997   for "precancerous "polyps , Dr Benjamine Sprague, [facility]  . TONSILLECTOMY     Social History   Social History  . Marital status: Single    Spouse name: N/A  . Number of children: N/A  . Years of education: N/A   Occupational History  . teacher [employer]   Social History Main Topics  . Smoking status: Former Smoker    Quit date: 03/27/2001  . Smokeless tobacco: Not on file  . Alcohol use No  . Drug use: Unknown  . Sexual activity: Not on file   Other Topics Concern  . Not on file   Social History Narrative   Gets regular exercise         Allergies  Allergen Reactions  . Tramadol Nausea Only  . Codeine     REACTION: GI UPSET   Family History  Problem Relation Age of Onset  . Colon cancer Father 45  . Hyperlipidemia Brother   . Colon cancer Maternal Aunt   . Diabetes Paternal Uncle   . Asthma Maternal Grandmother   . Leukemia Father     Past medical history, social, surgical and family history all reviewed in electronic medical record.  No pertanent information unless stated regarding to the chief complaint.   Review of Systems:Review of systems updated and as accurate as of 03/29/16  No headache, visual changes, nausea, vomiting,  diarrhea, constipation, dizziness, abdominal pain, skin rash, fevers, chills, night sweats, weight loss, swollen lymph nodes, body aches, joint swelling, muscle aches, chest pain, shortness of breath, mood changes.   Objective  Last menstrual period 03/27/2001. Systems examined below as of 03/29/16   General: No apparent distress alert and oriented x3 mood and affect normal, dressed appropriately.  HEENT: Pupils equal, extraocular movements intact  Respiratory: Patient's speak in full sentences and does not appear short of breath  Cardiovascular: No lower extremity edema, non tender, no erythema  Skin: Warm dry intact with no signs of infection or rash on extremities or on axial skeleton.  Abdomen: Soft nontender  Neuro: Cranial nerves II through XII are intact, neurovascularly intact in all extremities with 2+ DTRs and 2+ pulses.  Lymph: No lymphadenopathy of posterior or anterior cervical chain or axillae bilaterally.  Gait normal with good balance and coordination.  MSK:  Non tender with full range of motion and good stability and symmetric strength and tone of shoulders, elbows, wrist, hip, knee and ankles bilaterally.     Impression and Recommendations:     This case required medical decision making of moderate complexity.  Note: This dictation was prepared with Dragon dictation along with smaller phrase technology. Any transcriptional errors that result from this process are unintentional.

## 2016-03-30 ENCOUNTER — Ambulatory Visit: Payer: Self-pay | Admitting: Family Medicine

## 2016-04-06 ENCOUNTER — Ambulatory Visit: Payer: BC Managed Care – PPO | Admitting: Family Medicine

## 2016-04-06 ENCOUNTER — Other Ambulatory Visit: Payer: Self-pay | Admitting: Family Medicine

## 2016-04-10 MED ORDER — ALPRAZOLAM 0.25 MG PO TABS: ORAL_TABLET | ORAL | 0 refills | Status: DC

## 2016-04-10 NOTE — Telephone Encounter (Signed)
Received refill request for Xanax 0.25mg  (take 2 tab po qhs prn anxiety) #30, 0RF  Last Rf: 01/20/2016  Last Ov: 02/15/2016 Next Ov: Nothing scheduled at the present time. UDS: None  Forwarded to Provider for review, approval or denial.

## 2016-04-10 NOTE — Addendum Note (Signed)
Addended by: Sharen Counter D on: 04/10/2016 03:05 PM   Modules accepted: Orders

## 2016-04-10 NOTE — Telephone Encounter (Signed)
Hard copy of Rx faxed to [facility]: [phone].

## 2016-04-11 ENCOUNTER — Other Ambulatory Visit: Payer: Self-pay | Admitting: Family Medicine

## 2016-04-17 ENCOUNTER — Other Ambulatory Visit: Payer: Self-pay | Admitting: Family Medicine

## 2016-04-17 DIAGNOSIS — M62838 Other muscle spasm: Secondary | ICD-10-CM

## 2016-04-18 NOTE — Telephone Encounter (Signed)
Faxed hardcopy for soma to [facility]

## 2016-04-21 ENCOUNTER — Other Ambulatory Visit: Payer: Self-pay | Admitting: Family Medicine

## 2016-04-21 DIAGNOSIS — R1013 Epigastric pain: Secondary | ICD-10-CM

## 2016-05-23 ENCOUNTER — Other Ambulatory Visit: Payer: Self-pay | Admitting: Family Medicine

## 2016-05-23 DIAGNOSIS — R1013 Epigastric pain: Secondary | ICD-10-CM

## 2016-06-25 ENCOUNTER — Other Ambulatory Visit: Payer: Self-pay | Admitting: Family Medicine

## 2016-06-25 DIAGNOSIS — R1013 Epigastric pain: Secondary | ICD-10-CM

## 2016-06-25 DIAGNOSIS — M62838 Other muscle spasm: Secondary | ICD-10-CM

## 2016-06-26 NOTE — Telephone Encounter (Signed)
Faxed soma to [facility] [place]

## 2016-06-27 ENCOUNTER — Ambulatory Visit: Payer: Self-pay | Admitting: Family Medicine

## 2016-06-28 ENCOUNTER — Ambulatory Visit: Payer: Self-pay | Admitting: Family Medicine

## 2016-08-08 NOTE — Progress Notes (Signed)
[facility] [address] [place], [place] [address] Phone: [phone] Subjective:    I'm seeing this patient by the request  of:    CC: Back and rib pain  [id number]  [name] is a 56 y.o. female coming in with complaint of back and rib pain.  Been going on for a month or so. Does not rib number any true injury. Seems to be mostly on the right sign. See a chiropractor with no significant improvement. Also has done acupuncture with no improvement. States that it seems to start in the back that can wrap around to the anterior aspect is mild associated abdominal pain. Patient denies any radiation down the leg. Denies any fever, chills, any abnormal weight loss. Does respond ibuprofen when she takes a tries to avoid secondary to her reflux.     Past Medical History:  Diagnosis Date  . Allergy    ALSO DRY EYES  . Anemia    NOS  . 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 Melvyn Novas  . History of colonic polyps   . Hyperglycemia   . Hyperlipidemia   . Villous adenoma 1997   Past Surgical History:  Procedure Laterality Date  . ABDOMINAL HYSTERECTOMY     &USO for fibroid & servere anemia  . COLONOSCOPY    . FETAL BLOOD TRANSFUSION  03/2001   pre tah  . PARTIAL COLECTOMY  1997   for "precancerous "polyps , Dr Benjamine Sprague, [facility]  . TONSILLECTOMY     Social History   Social History  . Marital status: Single    Spouse name: N/A  . Number of children: N/A  . Years of education: N/A   Occupational History  . teacher [employer]   Social History Main Topics  . Smoking status: Former Smoker    Quit date: 03/27/2001  . Smokeless tobacco: Never Used  . Alcohol use No  . Drug use: Unknown  . Sexual activity: Not Asked   Other Topics Concern  . None   Social History Narrative   Gets regular exercise         Allergies  Allergen Reactions  . Tramadol Nausea Only  . Codeine     REACTION: GI UPSET   Family History    Problem Relation Age of Onset  . Colon cancer Father 85  . Hyperlipidemia Brother   . Colon cancer Maternal Aunt   . Diabetes Paternal Uncle   . Asthma Maternal Grandmother   . Leukemia Father     Past medical history, social, surgical and family history all reviewed in electronic medical record.  No pertanent information unless stated regarding to the chief complaint.   Review of Systems:Review of systems updated and as accurate as of 08/09/16  No headache, visual changes, nausea, vomiting, diarrhea, constipation, dizziness, abdominal pain, skin rash, fevers, chills, night sweats, weight loss, swollen lymph nodes, body aches, joint swelling, muscle aches, chest pain, shortness of breath, mood changes.   Objective  Blood pressure 118/82, pulse 66, height 5\' 5"  (1.651 m), weight 174 lb (78.9 kg), last menstrual period 03/27/2001, SpO2 97 %. Systems examined below as of 08/09/16   General: No apparent distress alert and oriented x3 mood and affect normal, dressed appropriately.  HEENT: Pupils equal, extraocular movements intact  Respiratory: Patient's speak in full sentences and does not appear short of breath  Cardiovascular: No lower extremity edema, non tender, no erythema  Skin: Warm dry intact with no  signs of infection or rash on extremities or on axial skeleton.  Abdomen: Soft And minorly tender in the right upper quadrant. Positive Murphy sign. Neuro: Cranial nerves II through XII are intact, neurovascularly intact in all extremities with 2+ DTRs and 2+ pulses.  Lymph: No lymphadenopathy of posterior or anterior cervical chain or axillae bilaterally.  Gait normal with good balance and coordination.  MSK:  Non tender with full range of motion and good stability and symmetric strength and tone of shoulders, elbows, wrist, hip, knee and ankles bilaterally.  Back Exam:  Inspection: Unremarkable  Motion: Flexion 40 deg, Extension 20 deg, Side Bending to 35 deg bilaterally,  Rotation  to 35 deg bilaterally  SLR laying: Negative  XSLR laying: Negative  Palpable tenderness: Tender to palpation and appears palmar musculature of the lumbar spine right greater leftprocess tenderness. FABER: negative. Sensory change: Gross sensation intact to all lumbar and sacral dermatomes.  Reflexes: 2+ at both patellar tendons, 2+ at achilles tendons, Babinski's downgoing.  Strength at foot  Plantar-flexion: 5/5 Dorsi-flexion: 5/5 Eversion: 5/5 Inversion: 5/5  Leg strength  Quad: 5/5 Hamstring: 5/5 Hip flexor: 5/5 Hip abductors: 4/5 but symmetric Gait unremarkable.  Osteopathic findings C4 flexed rotated and side bent left C7 flexed rotated and side bent left T3 extended rotated and side bent right inhaled third rib T8 extended rotated and side bent left L3 flexed rotated and side bent right Sacrum right on right   Procedure note  [id number]; 15 minutes spent for Therapeutic exercises as stated in above notes.  This included exercises focusing on stretching, strengthening, with significant focus on eccentric aspects. Low back exercises that included:  Pelvic tilt/bracing instruction to focus on control of the pelvic girdle and lower abdominal muscles  Glute strengthening exercises, focusing on proper firing of the glutes without engaging the low back muscles Proper stretching techniques for maximum relief for the hamstrings, hip flexors, low back and some rotation where tolerated    Proper technique shown and discussed handout in great detail with ATC.  All questions were discussed and answered.   Impression and Recommendations:     This case required medical decision making of moderate complexity.      Note: This dictation was prepared with Dragon dictation along with smaller phrase technology. Any transcriptional errors that result from this process are unintentional.

## 2016-08-09 ENCOUNTER — Encounter: Payer: Self-pay | Admitting: Family Medicine

## 2016-08-09 ENCOUNTER — Ambulatory Visit (INDEPENDENT_AMBULATORY_CARE_PROVIDER_SITE_OTHER)
Admission: RE | Admit: 2016-08-09 | Discharge: 2016-08-09 | Disposition: A | Discharge status: Home / Self Care | Payer: BC Managed Care – PPO | Source: Ambulatory Visit | Attending: Family Medicine | Admitting: Family Medicine

## 2016-08-09 ENCOUNTER — Ambulatory Visit (INDEPENDENT_AMBULATORY_CARE_PROVIDER_SITE_OTHER): Payer: BC Managed Care – PPO | Admitting: Family Medicine

## 2016-08-09 VITALS — BP 118/82 | HR 66 | Ht 65.0 in | Wt 174.0 lb

## 2016-08-09 DIAGNOSIS — M5416 Radiculopathy, lumbar region: Secondary | ICD-10-CM

## 2016-08-09 DIAGNOSIS — M999 Biomechanical lesion, unspecified: Secondary | ICD-10-CM

## 2016-08-09 DIAGNOSIS — M62830 Muscle spasm of back: Secondary | ICD-10-CM

## 2016-08-09 MED ORDER — TIZANIDINE HCL 4 MG PO TABS: 4.0000 mg | ORAL_TABLET | Freq: Every evening | ORAL | 2 refills | Status: AC

## 2016-08-09 NOTE — Assessment & Plan Note (Signed)
I believe the patient is having some more of a muscle spasm in the back. Has responded very well to manipulation today. We discussed icing regimen. Discussed core stability. Work with Product/process development scientist to learn home exercises in greater detail. Because of the longevity of this I do feel that x-rays are necessary. Has had intermittently for 3 months just worsening over the course last month. We discussed over-the-counter medications. Patient given muscle relaxer for nighttime. Follow-up 4 weeks.

## 2016-08-09 NOTE — Patient Instructions (Signed)
Good to see you  Xray downstairs, I will write you when I get the results.  Ice 20 minutes 2 times daily. Usually after activity and before bed. Exercises 3 times a week.  Vitamin D 2000 IU daily  Turmeric 500mg  twice daily if you can tolerate it.  Vimovo 1 pill 2 times a day for 3 days.  Zanaflex at night if you need it See me again in 4 weeks.

## 2016-08-09 NOTE — Assessment & Plan Note (Signed)
Decision today to treat with OMT was based on Physical Exam  After verbal consent patient was treated with HVLA, ME, FPR techniques in cervical, thoracic, lumbar and sacral areas  Patient tolerated the procedure well with improvement in symptoms  Patient given exercises, stretches and lifestyle modifications  See medications in patient instructions if given  Patient will follow up in 4 weeks 

## 2016-08-22 ENCOUNTER — Encounter: Payer: Self-pay | Admitting: Family Medicine

## 2016-08-22 ENCOUNTER — Ambulatory Visit (INDEPENDENT_AMBULATORY_CARE_PROVIDER_SITE_OTHER): Payer: BC Managed Care – PPO | Admitting: Family Medicine

## 2016-08-22 VITALS — BP 108/76 | HR 63 | Temp 98.2°F | Resp 16 | Ht 65.0 in | Wt 175.2 lb

## 2016-08-22 DIAGNOSIS — H6502 Acute serous otitis media, left ear: Secondary | ICD-10-CM

## 2016-08-22 NOTE — Patient Instructions (Signed)
Otitis Media With Effusion, Pediatric Otitis media with effusion (OME) occurs when there is inflammation of the middle ear and fluid in the middle ear space. There are no signs and symptoms of infection. The middle ear space contains air and the bones for hearing. Air in the middle ear space helps to transmit sound to the brain. OME is a common condition in children, and it often occurs after an ear infection. This condition may be present for several weeks or longer after an ear infection. Most cases of this condition get better on their own. What are the causes? OME is caused by a blockage of the eustachian tube in one or both ears. These tubes drain fluid in the ears to the back of the nose (nasopharynx). If the tissue in the tube swells up (edema), the tube closes. This prevents fluid from draining. Blockage can be caused by:  Ear infections.  Colds and other upper respiratory infections.  Allergies.  Irritants, such as tobacco smoke.  Enlarged adenoids. The adenoids are areas of soft tissue located high in the back of the throat, behind the nose and the roof of the mouth. They are part of the body's natural defense (immune) system.  A mass in the nasopharynx.  Damage to the ear caused by pressure changes (barotrauma).  What increases the risk? Your child is more likely to develop this condition if:  He or she has repeated ear and sinus infections.  He or she has allergies.  He or she is exposed to tobacco smoke.  He or she attends daycare.  He or she is not breastfed.  What are the signs or symptoms? Symptoms of this condition may not be obvious. Sometimes this condition does not have any symptoms, or symptoms may overlap with those of a cold or upper respiratory tract illness. Symptoms of this condition include:  Temporary hearing loss.  A feeling of fullness in the ear without pain.  Irritability or agitation.  Balance (vestibular) problems.  As a result of hearing  loss, your child may:  Listen to the TV at a loud volume.  Not respond to questions.  Ask "What?" often when spoken to.  Mistake or confuse one sound or word for another.  Perform poorly at school.  Have a poor attention span.  Become agitated or irritated easily.  How is this diagnosed? This condition is diagnosed with an ear exam. Your child's health care provider will look inside your child's ear with an instrument (otoscope) to check for redness, swelling, and fluid. Other tests may be done, including:  A test to check the movement of the eardrum (pneumatic otoscopy). This is done by squeezing a small amount of air into the ear.  A test that changes air pressure in the middle ear to check how well the eardrum moves and to see if the eustachian tube is working (tympanogram).  Hearing test (audiogram). This test involves playing tones at different pitches to see if your child can hear each tone.  How is this treated? Treatment for this condition depends on the cause. In many cases, the fluid goes away on its own. In some cases, your child may need a procedure to create a hole in the eardrum to allow fluid to drain (myringotomy) and to insert small drainage tubes (tympanostomy tubes) into the eardrums. These tubes help to drain fluid and prevent infection. This procedure may be recommended if:  OME does not get better over several months.  Your child has many ear   infections within several months.  Your child has noticeable hearing loss.  Your child has problems with speech and language development.  Surgery may also be done to remove the adenoids (adenoidectomy). Follow these instructions at home:  Give over-the-counter and prescription medicines only as told by your child's health care provider.  Keep children away from any tobacco smoke.  Keep all follow-up visits as told by your child's health care provider. This is important. How is this prevented?  Keep your  child's vaccinations up to date. Make sure your child gets all recommended vaccinations, including a pneumonia and flu vaccine.  Encourage hand washing. Your child should wash his or her hands often with soap and water. If there is no soap and water, he or she should use hand sanitizer.  Avoid exposing your child to tobacco smoke.  Breastfeed your baby, if possible. Babies who are breastfed as long as possible are less likely to develop this condition. Contact a health care provider if:  Your child's hearing does not get better after 3 months.  Your child's hearing is worse.  Your child has ear pain.  Your child has a fever.  Your child has drainage from the ear.  Your child is dizzy.  Your child has a lump on his or her neck. Get help right away if:  Your child has bleeding from the nose.  Your child cannot move part of her or his face.  Your child has trouble breathing.  Your child cannot smell.  Your child develops severe congestion.  Your child develops weakness.  Your child who is younger than 3 months has a temperature of 100F (38C) or higher. Summary  Otitis media with effusion (OME) occurs when there is inflammation of the middle ear and fluid in the middle ear space.  This condition is caused by blockage of one or both eustachian tubes, which drain fluid in the ears to the back of the nose.  Symptoms of this condition can include temporary hearing loss, a feeling of fullness in the ear, irritability or agitation, and balance (vestibular) problems. Sometimes, there are no symptoms.  This condition is diagnosed with an ear exam and tests, such as pneumatic otoscopy, tympanogram, and audiogram.  Treatment for this condition depends on the cause. In many cases, the fluid goes away on its own. This information is not intended to replace advice given to you by your health care provider. Make sure you discuss any questions you have with your health care  provider. Document Released: 06/03/2003 Document Revised: 02/03/2016 Document Reviewed: 02/03/2016 Elsevier Interactive Patient Education  2017 Elsevier Inc.  

## 2016-08-22 NOTE — Progress Notes (Signed)
Patient ID: [name], female   DOB: [birth year], 56 y.o.   MRN: [id number]     Subjective:  I acted as a Education administrator for Dr. [name].  Guerry Bruin, [place]   Patient ID: [name], female    DOB: [birth year], 56 y.o.   MRN: [id number]  Chief Complaint  Patient presents with  . Ear Fullness    left ear      Ear Fullness   There is pain in the left ear. This is a new problem. The current episode started yesterday. The pain is at a severity of 0/10. Pertinent negatives include no coughing, ear discharge, headaches, rash, rhinorrhea, sore throat or vomiting. Associated symptoms comments: Everything sounds muffled. She has tried nothing for the symptoms.    Patient is in today for left ear fullness.  Started on early Monday morning.  Feels muffled.   Patient Care Team: [name], Alferd Apa, DO as PCP - General (Family Medicine)   Past Medical History:  Diagnosis Date  . Allergy    ALSO DRY EYES  . Anemia    NOS  . 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 Melvyn Novas  . History of colonic polyps   . Hyperglycemia   . Hyperlipidemia   . Villous adenoma 1997    Past Surgical History:  Procedure Laterality Date  . ABDOMINAL HYSTERECTOMY     &USO for fibroid & servere anemia  . COLONOSCOPY    . FETAL BLOOD TRANSFUSION  03/2001   pre tah  . PARTIAL COLECTOMY  1997   for "precancerous "polyps , Dr Benjamine Sprague, [facility]  . TONSILLECTOMY      Family History  Problem Relation Age of Onset  . Colon cancer Father 79  . Hyperlipidemia Brother   . Colon cancer Maternal Aunt   . Diabetes Paternal Uncle   . Asthma Maternal Grandmother   . Leukemia Father     Social History   Social History  . Marital status: Single    Spouse name: N/A  . Number of children: N/A  . Years of education: N/A   Occupational History  . teacher [employer]   Social History Main Topics  . Smoking status: Former Smoker    Quit date: 03/27/2001  . Smokeless tobacco: Never Used  .  Alcohol use No  . Drug use: Unknown  . Sexual activity: Not on file   Other Topics Concern  . Not on file   Social History Narrative   Gets regular exercise          Outpatient Medications Prior to Visit  Medication Sig Dispense Refill  . ALPRAZolam (XANAX) 0.25 MG tablet TAKE ONE TABLET BY MOUTH ONCE DAILY AT BEDTIME AS NEEDED FOR ANXIETY 30 tablet 0  . carisoprodol (SOMA) 350 MG tablet TAKE ONE TABLET BY MOUTH 4 TIMES DAILY AS NEEDED FOR MUSCLE SPASM 30 tablet 0  . cetirizine (ZYRTEC) 10 MG tablet Take 10 mg by mouth daily as needed for allergies.    Marland Kitchen omeprazole (PRILOSEC) 20 MG capsule TAKE 1 CAPSULE BY MOUTH ONCE DAILY 90 capsule 0  . Diclofenac Sodium (PENNSAID) 2 % SOLN Apply 1 pump twice daily. 112 g 3  . diphenhydrAMINE (BENADRYL) 25 MG tablet Take 25 mg by mouth as needed.    . fluticasone (FLONASE) 50 MCG/ACT nasal spray Place 2 sprays into both nostrils daily. 5 g 0   No facility-administered medications prior to visit.     Allergies  Allergen Reactions  .  Tramadol Nausea Only  . Codeine     REACTION: GI UPSET    Review of Systems  Constitutional: Negative for fever and malaise/fatigue.  HENT: Negative for congestion, ear discharge, rhinorrhea and sore throat.   Eyes: Negative for blurred vision.  Respiratory: Negative for cough and shortness of breath.   Cardiovascular: Negative for chest pain, palpitations and leg swelling.  Gastrointestinal: Negative for vomiting.  Musculoskeletal: Negative for back pain.  Skin: Negative for rash.  Neurological: Negative for loss of consciousness and headaches.       Objective:    Physical Exam  Constitutional: She is oriented to person, place, and time. She appears well-developed and well-nourished. No distress.  HENT:  Head: Normocephalic and atraumatic.  Eyes: Conjunctivae are normal.  Neck: Normal range of motion. No thyromegaly present.  Cardiovascular: Normal rate and regular rhythm.   Pulmonary/Chest:  Effort normal and breath sounds normal. She has no wheezes.  Abdominal: Soft. Bowel sounds are normal. There is no tenderness.  Musculoskeletal: Normal range of motion. She exhibits no edema or deformity.  Neurological: She is alert and oriented to person, place, and time.  Skin: Skin is warm and dry. She is not diaphoretic.  Psychiatric: She has a normal mood and affect.    BP 108/76 (BP Location: Right Arm, Cuff Size: Normal)   Pulse 63   Temp 98.2 F (36.8 C) (Oral)   Resp 16   Ht 5\' 5"  (1.651 m)   Wt 175 lb 3.2 oz (79.5 kg)   LMP 03/27/2001   SpO2 98%   BMI 29.15 kg/m  Wt Readings from Last 3 Encounters:  08/22/16 175 lb 3.2 oz (79.5 kg)  08/09/16 174 lb (78.9 kg)  02/15/16 171 lb 9.6 oz (77.8 kg)   BP Readings from Last 3 Encounters:  08/22/16 108/76  08/09/16 118/82  02/15/16 120/78     Immunization History  Administered Date(s) Administered  . Influenza Split 02/06/2011, 12/25/2011  . Influenza,inj,Quad PF,36+ Mos 04/07/2013  . Influenza-Unspecified 12/26/2014, 02/01/2016  . PPD Test 03/17/2013  . Td 03/27/2001    Health Maintenance  Topic Date Due  . Hepatitis C Screening  12-12-1960  . HIV Screening  06/04/1975  . PAP SMEAR  03/17/2010  . TETANUS/TDAP  03/28/2011  . MAMMOGRAM  01/23/2016  . INFLUENZA VACCINE  10/25/2016  . COLONOSCOPY  07/23/2018    Lab Results  Component Value Date   WBC 3.8 (L) 12/08/2008   HGB 14.6 12/08/2008   HCT 43.0 12/08/2008   PLT 189.0 12/08/2008   GLUCOSE 77 12/08/2008   CHOL 237 (H) 12/08/2008   TRIG 59.0 12/08/2008   HDL 78.20 12/08/2008   LDLDIRECT 145.6 12/08/2008   ALT 17 12/08/2008   AST 22 12/08/2008   NA 141 12/08/2008   K 4.1 04/07/2013   CL 105 12/08/2008   CREATININE 1.0 01/21/2009   BUN 19 01/21/2009   CO2 31 12/08/2008   TSH 0.79 03/29/2010    Lab Results  Component Value Date   TSH 0.79 03/29/2010   Lab Results  Component Value Date   WBC 3.8 (L) 12/08/2008   HGB 14.6 12/08/2008   HCT  43.0 12/08/2008   MCV 89.6 12/08/2008   PLT 189.0 12/08/2008   Lab Results  Component Value Date   NA 141 12/08/2008   K 4.1 04/07/2013   CO2 31 12/08/2008   GLUCOSE 77 12/08/2008   BUN 19 01/21/2009   CREATININE 1.0 01/21/2009   BILITOT 1.4 (H) 12/08/2008   ALKPHOS  53 12/08/2008   AST 22 12/08/2008   ALT 17 12/08/2008   PROT 7.6 12/08/2008   ALBUMIN 4.1 12/08/2008   CALCIUM 9.2 04/07/2013   Lab Results  Component Value Date   CHOL 237 (H) 12/08/2008   Lab Results  Component Value Date   HDL 78.20 12/08/2008   No results found for: [facility] Lab Results  Component Value Date   TRIG 59.0 12/08/2008   Lab Results  Component Value Date   CHOLHDL 3 12/08/2008   No results found for: HGBA1C       Assessment & Plan:   Problem List Items Addressed This Visit    None    Visit Diagnoses    Acute serous otitis media of left ear, recurrence not specified    -  Primary    xyzal 5 mg daily nasacort AQ  Call or rto prn   I have discontinued Ms. Killion's diphenhydrAMINE, Diclofenac Sodium, and fluticasone. I am also having her maintain her cetirizine, ALPRAZolam, carisoprodol, and omeprazole.  No orders of the defined types were placed in this encounter.   CMA served as Education administrator during this visit. History, Physical and Plan performed by medical provider. Documentation and orders reviewed and attested to.  Ann Held, DO

## 2016-08-28 ENCOUNTER — Other Ambulatory Visit: Payer: Self-pay | Admitting: Family Medicine

## 2016-08-28 NOTE — Telephone Encounter (Signed)
Requesting:    alprazolam Contract    none UDS   none Last OV    08/22/2016 Last Refill    #30 no refills on 04/10/2016  Please Advise

## 2016-08-28 NOTE — Telephone Encounter (Signed)
Faxed hardcopy for alprazolam to [facility] [address] [employer] [place]

## 2016-09-02 ENCOUNTER — Other Ambulatory Visit: Payer: Self-pay | Admitting: Family Medicine

## 2016-09-02 DIAGNOSIS — M62838 Other muscle spasm: Secondary | ICD-10-CM

## 2016-09-04 NOTE — Telephone Encounter (Signed)
Faxed to [facility]

## 2016-09-06 ENCOUNTER — Ambulatory Visit: Payer: BC Managed Care – PPO | Admitting: Family Medicine

## 2016-10-14 ENCOUNTER — Other Ambulatory Visit: Payer: Self-pay | Admitting: Family Medicine

## 2016-10-14 DIAGNOSIS — M62838 Other muscle spasm: Secondary | ICD-10-CM

## 2016-10-17 ENCOUNTER — Other Ambulatory Visit: Payer: Self-pay | Admitting: Family Medicine

## 2016-10-17 DIAGNOSIS — M62838 Other muscle spasm: Secondary | ICD-10-CM

## 2016-10-17 NOTE — Telephone Encounter (Signed)
Requesting:Soma Contract: No UDS: NO Last OV: 08/22/16 Last Refill: 6/11 18 #30-0rf  Please Advise

## 2016-10-17 NOTE — Telephone Encounter (Signed)
rx faxed   Pc

## 2016-10-21 ENCOUNTER — Other Ambulatory Visit: Payer: Self-pay | Admitting: Family Medicine

## 2016-10-21 DIAGNOSIS — R1013 Epigastric pain: Secondary | ICD-10-CM

## 2016-11-19 ENCOUNTER — Other Ambulatory Visit: Payer: Self-pay | Admitting: Family Medicine

## 2016-11-19 DIAGNOSIS — M62838 Other muscle spasm: Secondary | ICD-10-CM

## 2016-11-20 NOTE — Telephone Encounter (Signed)
Pt is requesting refill on carisoprodol (Soma) 350mg .  Last OV: 08/22/2016 Last Fill: 10/17/2016 #30 and 0RF   Okay to refill?

## 2016-11-20 NOTE — Telephone Encounter (Signed)
Refill x1 

## 2016-11-20 NOTE — Telephone Encounter (Signed)
Rx phoned in to [facility].

## 2016-12-14 ENCOUNTER — Other Ambulatory Visit: Payer: Self-pay | Admitting: Family Medicine

## 2016-12-17 ENCOUNTER — Telehealth: Payer: Self-pay | Admitting: Family Medicine

## 2016-12-18 NOTE — Telephone Encounter (Signed)
Pt is requesting refill on alprazolam.  Last OV: 08/22/2016  Last Fill: 08/28/2016 #30 and 0RF UDS: None  NCCR printed; placed on ledge.

## 2016-12-19 NOTE — Telephone Encounter (Signed)
printed

## 2016-12-19 NOTE — Telephone Encounter (Signed)
Requesting: Alprazolam Contract: None UDS: None Last OV: 5.29.2018 Next OV: Not scheduled Last Refill: 6.4.2018   Please advise

## 2016-12-23 ENCOUNTER — Other Ambulatory Visit: Payer: Self-pay | Admitting: Family Medicine

## 2016-12-23 DIAGNOSIS — M62838 Other muscle spasm: Secondary | ICD-10-CM

## 2016-12-25 NOTE — Telephone Encounter (Signed)
SB-Last visit was 5.29.18 with Dr. Etter Sjogren for an acute visit/unsure if this refill request is ok/plz advise/thx dmf

## 2016-12-26 NOTE — Telephone Encounter (Signed)
SB approved/thx dmf

## 2017-01-18 LAB — HM MAMMOGRAPHY

## 2017-01-20 ENCOUNTER — Other Ambulatory Visit: Payer: Self-pay | Admitting: Family Medicine

## 2017-01-20 DIAGNOSIS — R1013 Epigastric pain: Secondary | ICD-10-CM

## 2017-01-25 ENCOUNTER — Other Ambulatory Visit: Payer: Self-pay | Admitting: Family Medicine

## 2017-01-25 DIAGNOSIS — R1013 Epigastric pain: Secondary | ICD-10-CM

## 2017-02-14 ENCOUNTER — Encounter: Payer: Self-pay | Admitting: Family Medicine

## 2017-03-11 ENCOUNTER — Other Ambulatory Visit: Payer: Self-pay | Admitting: Family Medicine

## 2017-03-11 DIAGNOSIS — M62838 Other muscle spasm: Secondary | ICD-10-CM

## 2017-03-12 NOTE — Telephone Encounter (Signed)
Requesting:Soma Contract:no UDS:no Last OV:08/22/16 Next PO:[id number]  Last Refill:12/25/16   #[id number]   Please advise

## 2017-03-14 ENCOUNTER — Other Ambulatory Visit: Payer: Self-pay | Admitting: Family Medicine

## 2017-03-14 DIAGNOSIS — M62838 Other muscle spasm: Secondary | ICD-10-CM

## 2017-03-15 ENCOUNTER — Telehealth: Payer: Self-pay | Admitting: *Deleted

## 2017-03-15 DIAGNOSIS — Z79899 Other long term (current) drug therapy: Secondary | ICD-10-CM

## 2017-03-15 NOTE — Telephone Encounter (Signed)
[facility] [address] is requesting refill for alprazolam  Database ran and is on your desk for review.  Last filled per database:  12/19/16 Last written: 12/19/16 Last ov: 08/22/16 Next ov: none Contract: none UDS: none

## 2017-03-15 NOTE — Telephone Encounter (Signed)
Ok to refill x 1  2 refills  Need contract and uds

## 2017-03-15 NOTE — Telephone Encounter (Signed)
Pt Called and notified that she will have to come in and sign Controlled contract as well as give UDS for any further refills. Pt agreed to comply and will come in tmrw morning at 0800. Requesting Xanax and is currently on Soma

## 2017-03-16 ENCOUNTER — Other Ambulatory Visit: Payer: BC Managed Care – PPO

## 2017-03-16 MED ORDER — ALPRAZOLAM 0.25 MG PO TABS: ORAL_TABLET | ORAL | 2 refills | Status: DC

## 2017-03-16 NOTE — Telephone Encounter (Signed)
Patient came in to office yesterday to do UDS and contract.  Nothing was printed out or ordered.  Patient scheduled for the 3rd.  Orders placed and contract.    Rx faxed to [facility]

## 2017-03-16 NOTE — Telephone Encounter (Signed)
Requesting: CARISOPRODOL 350 MG Contract: UDS: Last OV: 08/22/16 Next OV: 03/29/17 Last Refill:    Please advise

## 2017-03-29 ENCOUNTER — Other Ambulatory Visit: Payer: BC Managed Care – PPO

## 2017-04-28 ENCOUNTER — Other Ambulatory Visit: Payer: Self-pay | Admitting: Family Medicine

## 2017-04-28 DIAGNOSIS — R1013 Epigastric pain: Secondary | ICD-10-CM

## 2017-05-25 ENCOUNTER — Other Ambulatory Visit: Payer: Self-pay | Admitting: Family Medicine

## 2017-05-25 DIAGNOSIS — M62838 Other muscle spasm: Secondary | ICD-10-CM

## 2017-05-25 MED ORDER — CARISOPRODOL 350 MG PO TABS: 350.0000 mg | ORAL_TABLET | Freq: Four times a day (QID) | ORAL | 0 refills | Status: DC

## 2017-05-28 NOTE — Telephone Encounter (Signed)
[facility] requesting refill for soma  rx was printed by another assistant but not filled  Database ran and is on your desk for review.   Last filled per database:  Last written: 03/12/17 Last ov: 08/22/16 Next ov: none Contract: none UDS: none

## 2017-06-01 ENCOUNTER — Other Ambulatory Visit: Payer: Self-pay | Admitting: Family Medicine

## 2017-06-01 DIAGNOSIS — M62838 Other muscle spasm: Secondary | ICD-10-CM

## 2017-06-04 ENCOUNTER — Other Ambulatory Visit: Payer: Self-pay | Admitting: Family Medicine

## 2017-06-04 DIAGNOSIS — M62838 Other muscle spasm: Secondary | ICD-10-CM

## 2017-06-04 MED ORDER — CARISOPRODOL 350 MG PO TABS: 350.0000 mg | ORAL_TABLET | Freq: Four times a day (QID) | ORAL | 0 refills | Status: DC

## 2017-06-04 NOTE — Telephone Encounter (Signed)
[facility] for soma  Patient is only getting 7 day supply, can you write for more?  We have been filling for the past 2 weeks  Database ran 05/28/17 and is in media  Last written: 05/25/17 Last ov: 08/22/16 Next ov: none Contract: none UDS: none

## 2017-09-21 ENCOUNTER — Other Ambulatory Visit: Payer: Self-pay | Admitting: Family Medicine

## 2017-09-21 DIAGNOSIS — R1013 Epigastric pain: Secondary | ICD-10-CM

## 2017-09-21 MED ORDER — OMEPRAZOLE 20 MG PO CPDR: 20.0000 mg | DELAYED_RELEASE_CAPSULE | Freq: Every day | ORAL | 0 refills | Status: DC

## 2017-09-21 NOTE — Telephone Encounter (Signed)
FYI last OV 07/2016 with several cancellations since. Please advise on medication refill.

## 2017-09-24 NOTE — Telephone Encounter (Signed)
Omeprazole already refilled 6/28.

## 2017-10-04 ENCOUNTER — Ambulatory Visit: Payer: BC Managed Care – PPO | Admitting: Family Medicine

## 2017-10-04 ENCOUNTER — Encounter: Payer: Self-pay | Admitting: Family Medicine

## 2017-10-04 VITALS — BP 110/80 | HR 66 | Temp 98.5°F | Ht 64.0 in | Wt 177.8 lb

## 2017-10-04 DIAGNOSIS — M940 Chondrocostal junction syndrome [Tietze]: Secondary | ICD-10-CM | POA: Diagnosis not present

## 2017-10-04 MED ORDER — CARISOPRODOL 350 MG PO TABS: ORAL_TABLET | ORAL | 0 refills | Status: DC

## 2017-10-04 NOTE — Patient Instructions (Signed)
Dr. Etter Sjogren should be able to perform the technique you had done today.   OK to take Tylenol 1000 mg (2 extra strength tabs) or 975 mg (3 regular strength tabs) every 6 hours as needed.  Ice/cold pack over area for 10-15 min twice daily.  Let us know if you need anything.

## 2017-10-04 NOTE — Progress Notes (Signed)
Musculoskeletal Exam  Patient: [name] DOB: December 23, 1960  DOS: 10/04/2017  SUBJECTIVE:  Chief Complaint:   Chief Complaint  Patient presents with  . Chest Pain    c/o right side rib pain that has been ongoing but pt feels pain is worsening.     [name] is a 57 y.o.  female for evaluation and treatment of R chest wall pain.   Onset:  2 years ago. No inj or change in activity. Started to get worse a couple days ago.  Location: R lower rib cage Character:  sharp  Progression of issue:  has worsened Associated symptoms: Worse when she moves her arms, radiates through her side to her back Treatment: to date has been muscle relaxants, ice, Tylenol, OMT, Aleve, rest.   Neurovascular symptoms: no  ROS: Musculoskeletal/Extremities: +Chest wall pain  Past Medical History:  Diagnosis Date  . Allergy    ALSO DRY EYES  . Anemia    NOS  . 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 Melvyn Novas  . History of colonic polyps   . Hyperglycemia   . Hyperlipidemia   . Villous adenoma 1997    Objective: VITAL SIGNS: BP 110/80 (BP Location: Left Arm, Patient Position: Sitting, Cuff Size: Large)   Pulse 66   Temp 98.5 F (36.9 C) (Oral)   Ht 5\' 4"  (1.626 m)   Wt 177 lb 12.8 oz (80.6 kg)   LMP 03/27/2001   SpO2 95%   BMI 30.52 kg/m  Constitutional: Well formed, well developed. No acute distress. Cardiovascular: RRR Thorax & Lungs: CTAB, No accessory muscle use Abd: Soft, NT, ND, neg Murphy's, BS+ Musculoskeletal: R side.   Tenderness to palpation: Yes, over R ant axillary line at rib 11 Deformity: no Ecchymosis: no Neurologic: Normal sensory function.  Psychiatric: Normal mood. Age appropriate judgment and insight. Alert & oriented x 3.    PROCEDURE NOTE After discussing the procedure and risks, including but not limited to increased pain or stiffness and rarely nausea or dizziness, verbal consent was obtained.  Pre-procedure diagnosis: Somatic  dysfunction Post-procedure diagnosis: Same Procedure: OMT  Regions treated include R rib cage; counterstrain with the tissue response noted to be Improved.  The patient tolerated the procedure well, and there were no complications noted.  The patient was warned of the possibility of increased pain or stiffness of up to 48 hours duration and was asked to call with any unexpected problems.   Assessment:  Costochondritis  Plan: Ice, stretching, avoid aggravating activities. Refill Soma, take at night though. Dr. Etter Sjogren should be able to perform this procedure as well.  F/u prn. The patient voiced understanding and agreement to the plan.   [place], DO 10/04/17  3:45 PM

## 2017-10-07 ENCOUNTER — Other Ambulatory Visit: Payer: Self-pay | Admitting: Family Medicine

## 2017-10-08 ENCOUNTER — Ambulatory Visit: Payer: BC Managed Care – PPO | Admitting: Family Medicine

## 2017-10-08 NOTE — Telephone Encounter (Signed)
Requesting:xanax Contract:no FEE:TOLNT one  Last OV:08/22/16 Next JX:[id number] ov for today  Last Refill:03/16/17 #30-02rf Database:   Please advise

## 2017-10-17 ENCOUNTER — Ambulatory Visit: Payer: BC Managed Care – PPO | Admitting: Family Medicine

## 2017-12-07 ENCOUNTER — Other Ambulatory Visit: Payer: Self-pay | Admitting: Family Medicine

## 2017-12-12 ENCOUNTER — Other Ambulatory Visit: Payer: Self-pay | Admitting: Family Medicine

## 2017-12-12 DIAGNOSIS — R1013 Epigastric pain: Secondary | ICD-10-CM

## 2018-01-29 ENCOUNTER — Ambulatory Visit (INDEPENDENT_AMBULATORY_CARE_PROVIDER_SITE_OTHER): Payer: BC Managed Care – PPO

## 2018-01-29 ENCOUNTER — Ambulatory Visit: Payer: BC Managed Care – PPO | Admitting: Family Medicine

## 2018-01-29 ENCOUNTER — Encounter: Payer: Self-pay | Admitting: Family Medicine

## 2018-01-29 VITALS — BP 110/80 | HR 76 | Temp 98.4°F | Ht 64.0 in | Wt 176.0 lb

## 2018-01-29 DIAGNOSIS — M7660 Achilles tendinitis, unspecified leg: Secondary | ICD-10-CM

## 2018-01-29 MED ORDER — IBUPROFEN-FAMOTIDINE 800-26.6 MG PO TABS: 1.0000 | ORAL_TABLET | Freq: Three times a day (TID) | ORAL | 3 refills | Status: DC

## 2018-01-29 MED ORDER — DICLOFENAC SODIUM 2 % TD SOLN: 1.0000 "application " | Freq: Two times a day (BID) | TRANSDERMAL | 3 refills | Status: DC

## 2018-01-29 NOTE — Assessment & Plan Note (Signed)
It appears she has more of a bursa problem around the Achilles and the actual Achilles having tendinitis.  Does not appear to be partially torn or have any suggestion of a tear. -Counseled on home exercise therapy and supportive care. -Advised to obtain heel lifts. -Provided Pennsaid and Duexis. -If still having pain at follow-up would consider placing in a cam walker and referral to physical therapy.  Could consider imaging.

## 2018-01-29 NOTE — Progress Notes (Signed)
ZAKYA HALABI - 57 y.o. female MRN [id number]  Date of birth: 1961/03/04  SUBJECTIVE:  Including CC & ROS.  Chief Complaint  Patient presents with  . Tendonitis    pt c/o achilles tendon in right leg, pt sts pain got severe last several weeks. per pt, she does have a small tear. pt has been applying ice and tylenol    KIMYATTA LECY is a 57 y.o. female that is presenting with right Achilles pain.  The pain is been gone going for a few weeks.  Denies any inciting event.  Pain is localized to the right Achilles.  Seems to be located near the insertion.  She tends to walk and notices the pain worse with walking.  She is got new shoes recently.  She denies any changes to her exercise routine.  Pain is sharp and stabbing in nature.  Pain is moderate in severity.  Pain is intermittent.  Has become more constant recently.   Review of Systems  Constitutional: Negative for fever.  HENT: Negative for congestion.   Respiratory: Negative for cough.   Cardiovascular: Negative for chest pain.  Gastrointestinal: Negative for abdominal pain.  Musculoskeletal: Negative for joint swelling.  Skin: Negative for color change.  Neurological: Negative for weakness.  Hematological: Negative for adenopathy.  Psychiatric/Behavioral: Negative for agitation.    HISTORY: Past Medical, Surgical, Social, and Family History Reviewed & Updated per EMR.   Pertinent Historical Findings include:  Past Medical History:  Diagnosis Date  . Allergy    ALSO DRY EYES  . Anemia    NOS  . 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 Melvyn Novas  . History of colonic polyps   . Hyperglycemia   . Hyperlipidemia   . Villous adenoma 1997    Past Surgical History:  Procedure Laterality Date  . ABDOMINAL HYSTERECTOMY     &USO for fibroid & servere anemia  . COLONOSCOPY    . FETAL BLOOD TRANSFUSION  03/2001   pre tah  . PARTIAL COLECTOMY  1997   for "precancerous "polyps , Dr Benjamine Sprague, [facility]  . TONSILLECTOMY        Allergies  Allergen Reactions  . Tramadol Nausea Only  . Codeine     REACTION: GI UPSET    Family History  Problem Relation Age of Onset  . Colon cancer Father 62  . Hyperlipidemia Brother   . Colon cancer Maternal Aunt   . Diabetes Paternal Uncle   . Asthma Maternal Grandmother   . Leukemia Father      Social History   Socioeconomic History  . Marital status: Single    Spouse name: Not on file  . Number of children: Not on file  . Years of education: Not on file  . Highest education level: Not on file  Occupational History  . Occupation: Product manager: [employer]  . Financial resource strain: Not on file  . Food insecurity:    Worry: Not on file    Inability: Not on file  . Transportation needs:    Medical: Not on file    Non-medical: Not on file  Tobacco Use  . Smoking status: Former Smoker    Last attempt to quit: 03/27/2001    Years since quitting: 16.8  . Smokeless tobacco: Never Used  Substance and Sexual Activity  . Alcohol use: No  . Drug use: Not on file  . Sexual activity: Not on file  Lifestyle  .  Physical activity:    Days per week: Not on file    Minutes per session: Not on file  . Stress: Not on file  Relationships  . Social connections:    Talks on phone: Not on file    Gets together: Not on file    Attends religious service: Not on file    Active member of club or organization: Not on file    Attends meetings of clubs or organizations: Not on file    Relationship status: Not on file  . Intimate partner violence:    Fear of current or ex partner: Not on file    Emotionally abused: Not on file    Physically abused: Not on file    Forced sexual activity: Not on file  Other Topics Concern  . Not on file  Social History Narrative   Gets regular exercise           PHYSICAL EXAM:  VS: BP 110/80 (BP Location: Right Arm, Patient Position: Sitting, Cuff Size: Normal)   Pulse 76   Temp 98.4 F (36.9 C)  (Oral)   Ht 5\' 4"  (1.626 m)   Wt 176 lb (79.8 kg)   LMP 03/27/2001   SpO2 96%   BMI 30.21 kg/m  Physical Exam Gen: NAD, alert, cooperative with exam, well-appearing ENT: normal lips, normal nasal mucosa,  Eye: normal EOM, normal conjunctiva and lids CV:  no edema, +2 pedal pulses   Resp: no accessory muscle use, non-labored,  Skin: no rashes, no areas of induration  Neuro: normal tone, normal sensation to touch Psych:  normal insight, alert and oriented MSK:  Right ankle:  No obvious effusion or ecchymosis of the Achilles. Normal range of motion. Normal strength to resistance. Plantarflexion with [place] Silos test. Some tenderness palpation at the insertion of the Achilles and 2 cm proximally. Has a neutral foot. Able to rise up on tiptoes. Normal gait. Neurovascular intact  Limited ultrasound: Right achilles:  Small posterior calcaneal spurs observed at the insertion.  No increased hypervascularity of this region.  Appears to be a hypoechoic change superficial to the insertion which could suggest the bursitis.  There also appears to be a retrocalcaneal bursitis present. Mild thickening of the Achilles about 4 cm proximal to the insertion No mucus changes of the intracellular matrix to suggest tendinopathy  Summary: Findings suggestive retrocalcaneal bursitis and superficial bursitis to the insertion  Ultrasound and interpretation by Clearance Coots, MD      ASSESSMENT & PLAN:   Achilles tendon pain It appears she has more of a bursa problem around the Achilles and the actual Achilles having tendinitis.  Does not appear to be partially torn or have any suggestion of a tear. -Counseled on home exercise therapy and supportive care. -Advised to obtain heel lifts. -Provided Pennsaid and Duexis. -If still having pain at follow-up would consider placing in a cam walker and referral to physical therapy.  Could consider imaging.

## 2018-01-29 NOTE — Patient Instructions (Signed)
Nice to meet you  Please obtain a heel lift to wear in your shoes. [employer] has these  Please try the medication  Please try compression  Please follow up with me in 2-3 weeks if your pain isn't improving.

## 2018-01-30 ENCOUNTER — Ambulatory Visit: Payer: BC Managed Care – PPO | Admitting: Family Medicine

## 2018-02-25 ENCOUNTER — Other Ambulatory Visit: Payer: Self-pay | Admitting: Family Medicine

## 2018-02-26 ENCOUNTER — Other Ambulatory Visit: Payer: Self-pay | Admitting: Family Medicine

## 2018-02-26 MED ORDER — CARISOPRODOL 350 MG PO TABS: ORAL_TABLET | ORAL | 0 refills | Status: DC

## 2018-02-26 NOTE — Telephone Encounter (Signed)
Controlled substance --- will needs uds and contract if we need to con't

## 2018-02-26 NOTE — Telephone Encounter (Signed)
Soma refill.   Last OV: 01/29/2018 w/ Dr. Raeford Razor Last Fill: 12/08/2017 #30 and 0RF UDS: None

## 2018-02-26 NOTE — Telephone Encounter (Signed)
Database ran and is on your desk for review.   Will call patient to remind her of controlled substance policy

## 2018-03-21 ENCOUNTER — Other Ambulatory Visit: Payer: Self-pay | Admitting: Family Medicine

## 2018-03-21 DIAGNOSIS — R1013 Epigastric pain: Secondary | ICD-10-CM

## 2018-04-15 ENCOUNTER — Other Ambulatory Visit: Payer: Self-pay | Admitting: Family Medicine

## 2018-04-16 MED ORDER — CARISOPRODOL 350 MG PO TABS: ORAL_TABLET | ORAL | 0 refills | Status: DC

## 2018-04-16 NOTE — Telephone Encounter (Signed)
Refill request for Soma.   Last OV: 01/29/2018 w/ Dr. Raeford Razor Last Fill: 02/26/2018 #30 and 0RF UDS: None

## 2018-06-06 ENCOUNTER — Other Ambulatory Visit: Payer: Self-pay | Admitting: Family Medicine

## 2018-06-07 ENCOUNTER — Other Ambulatory Visit: Payer: Self-pay | Admitting: Family Medicine

## 2018-06-07 DIAGNOSIS — R1013 Epigastric pain: Secondary | ICD-10-CM

## 2018-06-07 MED ORDER — CARISOPRODOL 350 MG PO TABS: ORAL_TABLET | ORAL | 0 refills | Status: DC

## 2018-06-07 MED ORDER — OMEPRAZOLE 20 MG PO CPDR: 20.0000 mg | DELAYED_RELEASE_CAPSULE | Freq: Every day | ORAL | 3 refills | Status: DC

## 2018-06-07 NOTE — Telephone Encounter (Addendum)
Requesting: Carisoprodol  Contract: N/A UDS: N/A Last OV: 10/04/2017 Next OV: N/A Last Refill: 04/16/2018, #30--0 RF Database:   Please advise

## 2018-06-08 ENCOUNTER — Other Ambulatory Visit: Payer: Self-pay | Admitting: Family Medicine

## 2018-06-08 MED ORDER — CARISOPRODOL 350 MG PO TABS: ORAL_TABLET | ORAL | 0 refills | Status: DC

## 2018-06-10 NOTE — Telephone Encounter (Signed)
Requesting: Soma Contract: N/A UDS: N/A Last OV: 10/04/2017 Next OV: N/A Last Refill: 06/08/2018, #30-- 0 RF Database:   Please advise

## 2018-08-12 ENCOUNTER — Telehealth: Payer: Self-pay | Admitting: Family Medicine

## 2018-08-14 NOTE — Telephone Encounter (Signed)
Pt would like call back about getting this filled.  She is having back pain.  She can make appt if needed.

## 2018-08-15 ENCOUNTER — Other Ambulatory Visit: Payer: Self-pay | Admitting: Family Medicine

## 2018-08-16 ENCOUNTER — Other Ambulatory Visit: Payer: Self-pay | Admitting: Family Medicine

## 2018-08-16 DIAGNOSIS — M549 Dorsalgia, unspecified: Secondary | ICD-10-CM

## 2018-08-16 MED ORDER — CARISOPRODOL 350 MG PO TABS: ORAL_TABLET | ORAL | 0 refills | Status: DC

## 2018-08-16 NOTE — Telephone Encounter (Signed)
Soma  Last written: 06/10/18 Last ov: 01/29/18 Next ov: non3 Contract: none UDS: none

## 2018-08-16 NOTE — Telephone Encounter (Signed)
Refill sent  Will need ov if no better

## 2018-08-16 NOTE — Telephone Encounter (Signed)
Patient notified

## 2018-09-23 ENCOUNTER — Other Ambulatory Visit: Payer: Self-pay | Admitting: Family Medicine

## 2018-09-23 ENCOUNTER — Encounter: Payer: BC Managed Care – PPO | Admitting: Internal Medicine

## 2018-09-24 NOTE — Telephone Encounter (Signed)
Requesting: Xanax Contract: N/A UDS: N/A Last OV: 10/04/2017 Next OV: N/A Last Refill: 10/08/2017, #30--2 RF Database:   Please advise

## 2018-10-02 ENCOUNTER — Telehealth: Payer: Self-pay | Admitting: *Deleted

## 2018-10-02 ENCOUNTER — Other Ambulatory Visit: Payer: Self-pay

## 2018-10-02 NOTE — Telephone Encounter (Signed)
Called pt. To do pre visit and she stated that "I have been around my sister-in-law and brother and my sister-in-law is being tested for the covid-19 virus and I rather cancel until I know I am in the clear for the virus". Pt. Denies symptoms of the virus but stated "I do have allergies and they are bothering me". Pt. Stated"I will call back and reschedule procedure when I am in the clear."

## 2018-10-16 ENCOUNTER — Encounter: Payer: BC Managed Care – PPO | Admitting: Internal Medicine

## 2018-11-24 ENCOUNTER — Encounter: Payer: Self-pay | Admitting: Family Medicine

## 2018-11-27 ENCOUNTER — Encounter: Payer: Self-pay | Admitting: Family Medicine

## 2018-11-27 NOTE — Telephone Encounter (Signed)
Are you ok with note with out seeing her?

## 2018-11-27 NOTE — Telephone Encounter (Signed)
Note is in mychart

## 2018-12-10 ENCOUNTER — Other Ambulatory Visit: Payer: Self-pay | Admitting: Family Medicine

## 2018-12-10 ENCOUNTER — Ambulatory Visit (INDEPENDENT_AMBULATORY_CARE_PROVIDER_SITE_OTHER)
Admission: RE | Admit: 2018-12-10 | Discharge: 2018-12-10 | Disposition: A | Discharge status: Home / Self Care | Payer: BC Managed Care – PPO | Source: Ambulatory Visit | Attending: Family Medicine | Admitting: Family Medicine

## 2018-12-10 ENCOUNTER — Other Ambulatory Visit: Payer: Self-pay

## 2018-12-10 ENCOUNTER — Encounter: Payer: Self-pay | Admitting: Family Medicine

## 2018-12-10 ENCOUNTER — Ambulatory Visit: Payer: BC Managed Care – PPO | Admitting: Family Medicine

## 2018-12-10 VITALS — BP 124/80 | HR 67 | Temp 97.5°F | Resp 18 | Ht 64.0 in | Wt 180.0 lb

## 2018-12-10 DIAGNOSIS — R0781 Pleurodynia: Secondary | ICD-10-CM

## 2018-12-10 DIAGNOSIS — J181 Lobar pneumonia, unspecified organism: Secondary | ICD-10-CM

## 2018-12-10 DIAGNOSIS — J189 Pneumonia, unspecified organism: Secondary | ICD-10-CM

## 2018-12-10 DIAGNOSIS — H04129 Dry eye syndrome of unspecified lacrimal gland: Secondary | ICD-10-CM | POA: Diagnosis not present

## 2018-12-10 DIAGNOSIS — Z23 Encounter for immunization: Secondary | ICD-10-CM | POA: Diagnosis not present

## 2018-12-10 DIAGNOSIS — M549 Dorsalgia, unspecified: Secondary | ICD-10-CM

## 2018-12-10 MED ORDER — CARISOPRODOL 350 MG PO TABS: ORAL_TABLET | ORAL | 0 refills | Status: DC

## 2018-12-10 MED ORDER — AZITHROMYCIN 250 MG PO TABS: ORAL_TABLET | ORAL | 0 refills | Status: DC

## 2018-12-10 NOTE — Patient Instructions (Signed)
Costochondritis Costochondritis is swelling and irritation (inflammation) of the tissue (cartilage) that connects your ribs to your breastbone (sternum). This causes pain in the front of your chest. Usually, the pain:  Starts gradually.  Is in more than one rib. This condition usually goes away on its own over time. Follow these instructions at home:  Do not do anything that makes your pain worse.  If directed, put ice on the painful area: ? Put ice in a plastic bag. ? Place a towel between your skin and the bag. ? Leave the ice on for 20 minutes, 2-3 times a day.  If directed, put heat on the affected area as often as told by your doctor. Use the heat source that your doctor tells you to use, such as a moist heat pack or a heating pad. ? Place a towel between your skin and the heat source. ? Leave the heat on for 20-30 minutes. ? Take off the heat if your skin turns bright red. This is very important if you cannot feel pain, heat, or cold. You may have a greater risk of getting burned.  Take over-the-counter and prescription medicines only as told by your doctor.  Return to your normal activities as told by your doctor. Ask your doctor what activities are safe for you.  Keep all follow-up visits as told by your doctor. This is important. Contact a doctor if:  You have chills or a fever.  Your pain does not go away or it gets worse.  You have a cough that does not go away. Get help right away if:  You are short of breath. This information is not intended to replace advice given to you by your health care provider. Make sure you discuss any questions you have with your health care provider. Document Released: 08/30/2007 Document Revised: 03/28/2017 Document Reviewed: 07/07/2015 Elsevier Patient Education  2020 Elsevier Inc.  

## 2018-12-10 NOTE — Progress Notes (Signed)
Patient ID: [name], female    DOB: Oct 27, 1960  Age: 58 y.o. MRN: [id number]    Subjective:  Subjective  HPI ONNOLEE MAYWALD presents for c/o dry eye.   Her eye dr wanted her to have lab work done.  She also c/o r rib pain x months.  No cough, no fever, no known injury  Review of Systems  Constitutional: Negative for activity change, appetite change, fatigue and unexpected weight change.  Respiratory: Negative for cough and shortness of breath.   Cardiovascular: Negative for chest pain and palpitations.  Psychiatric/Behavioral: Negative for behavioral problems and dysphoric mood. The patient is not nervous/anxious.     History Past Medical History:  Diagnosis Date  . Allergy    ALSO DRY EYES  . Anemia    NOS  . 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 Melvyn Novas  . History of colonic polyps   . Hyperglycemia   . Hyperlipidemia   . Villous adenoma 1997    She has a past surgical history that includes Abdominal hysterectomy; Fetal blood transfusion (03/2001); Tonsillectomy; Partial colectomy (1997); and Colonoscopy.   Her family history includes Asthma in her maternal grandmother; Colon cancer in her maternal aunt; Colon cancer (age of onset: 36) in her father; Diabetes in her paternal uncle; Hyperlipidemia in her brother; Leukemia in her father.She reports that she quit smoking about 17 years ago. She has never used smokeless tobacco. She reports that she does not drink alcohol. No history on file for drug.  Current Outpatient Medications on File Prior to Visit  Medication Sig Dispense Refill  . ALPRAZolam (XANAX) 0.25 MG tablet TAKE 1 TABLET BY MOUTH ONCE DAILY AT BEDTIME AS NEEDED FOR ANXIETY 30 tablet 0  . cetirizine (ZYRTEC) 10 MG tablet Take 10 mg by mouth daily as needed for allergies.    . Diclofenac Sodium (PENNSAID) 2 % SOLN Place 1 application onto the skin 2 (two) times daily. 1 Bottle 3  . omeprazole (PRILOSEC) 20 MG capsule Take 1 capsule (20 mg total) by  mouth daily. 90 capsule 3  . Ibuprofen-Famotidine 800-26.6 MG TABS Take 1 tablet by mouth 3 (three) times daily. (Patient not taking: Reported on 12/10/2018) 90 tablet 3  . XIIDRA 5 % SOLN      No current facility-administered medications on file prior to visit.      Objective:  Objective  Physical Exam Vitals signs and nursing note reviewed.  Constitutional:      Appearance: She is well-developed.  HENT:     Head: Normocephalic and atraumatic.  Eyes:     Conjunctiva/sclera: Conjunctivae normal.  Neck:     Musculoskeletal: Normal range of motion and neck supple.     Thyroid: No thyromegaly.     Vascular: No carotid bruit or JVD.  Cardiovascular:     Rate and Rhythm: Normal rate and regular rhythm.     Heart sounds: Normal heart sounds. No murmur.  Pulmonary:     Effort: Pulmonary effort is normal. No respiratory distress.     Breath sounds: Normal breath sounds. No stridor. No wheezing, rhonchi or rales.  Chest:     Chest wall: No tenderness.  Musculoskeletal:        General: Tenderness present.       Arms:  Neurological:     Mental Status: She is alert and oriented to person, place, and time.    BP 124/80 (BP Location: Left Arm, Patient Position: Sitting, Cuff Size: Normal)   Pulse 67  Temp (!) 97.5 F (36.4 C) (Temporal)   Resp 18   Ht 5\' 4"  (1.626 m)   Wt 180 lb (81.6 kg)   LMP 03/27/2001   SpO2 97%   BMI 30.90 kg/m  Wt Readings from Last 3 Encounters:  12/10/18 180 lb (81.6 kg)  01/29/18 176 lb (79.8 kg)  10/04/17 177 lb 12.8 oz (80.6 kg)     Lab Results  Component Value Date   WBC 3.8 (L) 12/08/2008   HGB 14.6 12/08/2008   HCT 43.0 12/08/2008   PLT 189.0 12/08/2008   GLUCOSE 77 12/08/2008   CHOL 237 (H) 12/08/2008   TRIG 59.0 12/08/2008   HDL 78.20 12/08/2008   LDLDIRECT 145.6 12/08/2008   ALT 17 12/08/2008   AST 22 12/08/2008   NA 141 12/08/2008   K 4.1 04/07/2013   CL 105 12/08/2008   CREATININE 1.0 01/21/2009   BUN 19 01/21/2009   CO2  31 12/08/2008   TSH 0.79 03/29/2010    Dg Lumbar Spine Complete  Result Date: 08/09/2016 CLINICAL DATA:  Chronic lumbago EXAM: LUMBAR SPINE - COMPLETE 4+ VIEW COMPARISON:  December 25, 2011 FINDINGS: Frontal, lateral, spot lumbosacral lateral, and bilateral oblique views were obtained. There are 5 non-rib-bearing lumbar type vertebral bodies. There is no fracture or spondylolisthesis. The disc spaces appear unremarkable. There is facet osteoarthritic change at L5-S1 on the left. Other facets appear unremarkable. 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 No appreciable joint space narrowing. No fracture or spondylolisthesis. Electronically Signed   By: Lowella Grip III M.D.   On: 08/09/2016 09:17     Assessment & Plan:  Plan  I am having Grant L. Mccoskey maintain her cetirizine, Xiidra, Ibuprofen-Famotidine, Diclofenac Sodium, omeprazole, ALPRAZolam, and carisoprodol.  Meds ordered this encounter  Medications  . carisoprodol (SOMA) 350 MG tablet    Sig: TAKE 1/2 TO 1 (ONE-HALF TO ONE) TABLET BY MOUTH AT NIGHT AS NEEDED    Dispense:  30 tablet    Refill:  0    Problem List Items Addressed This Visit      Unprioritized   Pneumonia of both lower lobes due to infectious organism ([place])    Xray -- B/L pneumonia  z pak ordered  F/u 3-4 weeks covid testing also to be done       Relevant Orders   Novel Coronavirus, NAA ([facility])    Other Visit Diagnoses    Need for influenza vaccination    -  Primary   Relevant Orders   Flu Vaccine QUAD 6+ mos PF IM (Fluarix Quad PF) (Completed)   Rib pain on right side       Relevant Orders   DG Ribs Unilateral W/Chest Right (Completed)   Dry eye       Relevant Orders   CBC with Differential/Platelet   Comprehensive metabolic panel   Rheumatoid Factor   Antinuclear Antib (ANA)   Sedimentation rate   TSH   CRP High sensitivity   Back pain, unspecified back location, unspecified back pain laterality, unspecified  chronicity       Relevant Medications   carisoprodol (SOMA) 350 MG tablet      Follow-up: No follow-ups on file.  Ann Held, DO

## 2018-12-11 ENCOUNTER — Encounter: Payer: Self-pay | Admitting: Family Medicine

## 2018-12-11 ENCOUNTER — Other Ambulatory Visit: Payer: Self-pay

## 2018-12-11 DIAGNOSIS — Z20822 Contact with and (suspected) exposure to covid-19: Secondary | ICD-10-CM

## 2018-12-11 DIAGNOSIS — J189 Pneumonia, unspecified organism: Secondary | ICD-10-CM | POA: Insufficient documentation

## 2018-12-11 LAB — COMPREHENSIVE METABOLIC PANEL
ALT: 21 U/L (ref 0–35)
AST: 23 U/L (ref 0–37)
Albumin: 4.1 g/dL (ref 3.5–5.2)
Alkaline Phosphatase: 55 U/L (ref 39–117)
BUN: 17 mg/dL (ref 6–23)
CO2: 28 mEq/L (ref 19–32)
Calcium: 9.8 mg/dL (ref 8.4–10.5)
Chloride: 103 mEq/L (ref 96–112)
Creatinine, Ser: 1.03 mg/dL (ref 0.40–1.20)
GFR: 66.47 mL/min (ref >60.00)
Glucose, Bld: 79 mg/dL (ref 70–99)
Potassium: 4.2 mEq/L (ref 3.5–5.1)
Sodium: 140 mEq/L (ref 135–145)
Total Bilirubin: 0.9 mg/dL (ref 0.2–1.2)
Total Protein: 7.1 g/dL (ref 6.0–8.3)

## 2018-12-11 LAB — CBC WITH DIFFERENTIAL/PLATELET
Basophils Absolute: 0.1 10*3/uL (ref 0.0–0.1)
Basophils Relative: 1.1 % (ref 0.0–3.0)
Eosinophils Absolute: 0 10*3/uL (ref 0.0–0.7)
Eosinophils Relative: 0.6 % (ref 0.0–5.0)
HCT: 40.4 % (ref 36.0–46.0)
Hemoglobin: 13.9 g/dL (ref 12.0–15.0)
Lymphocytes Relative: 16.3 % (ref 12.0–46.0)
Lymphs Abs: 1 10*3/uL (ref 0.7–4.0)
MCHC: 34.5 g/dL (ref 30.0–36.0)
MCV: 84.8 fl (ref 78.0–100.0)
Monocytes Absolute: 0.6 10*3/uL (ref 0.1–1.0)
Monocytes Relative: 9.5 % (ref 3.0–12.0)
Neutro Abs: 4.5 10*3/uL (ref 1.4–7.7)
Neutrophils Relative %: 72.5 % (ref 43.0–77.0)
Platelets: 253 10*3/uL (ref 150.0–400.0)
RBC: 4.76 Mil/uL (ref 3.87–5.11)
RDW: 14.4 % (ref 11.5–15.5)
WBC: 6.3 10*3/uL (ref 4.0–10.5)

## 2018-12-11 LAB — ANA: Anti Nuclear Antibody (ANA): NEGATIVE

## 2018-12-11 LAB — RHEUMATOID FACTOR: Rheumatoid fact SerPl-aCnc: <14 IU/mL (ref <14)

## 2018-12-11 LAB — TSH: TSH: 1.5 u[IU]/mL (ref 0.35–4.50)

## 2018-12-11 LAB — HIGH SENSITIVITY CRP: CRP, High Sensitivity: 1.9 mg/L (ref 0.000–5.000)

## 2018-12-11 LAB — SEDIMENTATION RATE: Sed Rate: 11 mm/hr (ref 0–30)

## 2018-12-11 NOTE — Assessment & Plan Note (Signed)
Xray -- B/L pneumonia  z pak ordered  F/u 3-4 weeks covid testing also to be done

## 2018-12-12 ENCOUNTER — Encounter: Payer: Self-pay | Admitting: Family Medicine

## 2018-12-12 ENCOUNTER — Other Ambulatory Visit: Payer: Self-pay | Admitting: Family Medicine

## 2018-12-12 ENCOUNTER — Telehealth: Payer: Self-pay

## 2018-12-12 ENCOUNTER — Other Ambulatory Visit: Payer: Self-pay

## 2018-12-12 DIAGNOSIS — J189 Pneumonia, unspecified organism: Secondary | ICD-10-CM

## 2018-12-12 LAB — NOVEL CORONAVIRUS, NAA: SARS-CoV-2, NAA: NOT DETECTED

## 2018-12-12 NOTE — Telephone Encounter (Signed)
ERROR-- to close 

## 2018-12-13 ENCOUNTER — Encounter: Payer: Self-pay | Admitting: Family Medicine

## 2018-12-13 NOTE — Telephone Encounter (Signed)
We assume pneumonia is bacterial--- which is why she is on an antibiotic Pneumonia is not contagious but her immune system is affected and she needs to stay home

## 2018-12-17 ENCOUNTER — Telehealth: Payer: Self-pay | Admitting: *Deleted

## 2018-12-17 DIAGNOSIS — J189 Pneumonia, unspecified organism: Secondary | ICD-10-CM

## 2018-12-17 MED ORDER — DOXYCYCLINE HYCLATE 100 MG PO TABS: 100.0000 mg | ORAL_TABLET | Freq: Two times a day (BID) | ORAL | 0 refills | Status: AC

## 2018-12-17 NOTE — Telephone Encounter (Signed)
Patient called and stated that she is still having mid back pain and dry cough and she would like to know when should she see a change or start to feel better?  She has no fever (temp running between 96-97).  She was put on zpak on 12/10/18 and she finished that last Saturday.

## 2018-12-17 NOTE — Telephone Encounter (Signed)
z pack stays in the body for 10 days But send doxycycline 100 mg bid x 10 days Repeat xray next week

## 2018-12-17 NOTE — Telephone Encounter (Signed)
Patient notified of notes.  Medication sent in and cxr ordered.  Patient agreed and will take medication and come in for xray next week.

## 2018-12-31 ENCOUNTER — Other Ambulatory Visit: Payer: Self-pay | Admitting: Family Medicine

## 2019-01-01 NOTE — Telephone Encounter (Signed)
Last written: 09/24/18 Last ov: 12/10/18 Next ov: nothing scheduled Contract: none UDS: none

## 2019-01-03 ENCOUNTER — Other Ambulatory Visit: Payer: Self-pay

## 2019-01-03 ENCOUNTER — Ambulatory Visit (INDEPENDENT_AMBULATORY_CARE_PROVIDER_SITE_OTHER)
Admission: RE | Admit: 2019-01-03 | Discharge: 2019-01-03 | Disposition: A | Discharge status: Home / Self Care | Payer: BC Managed Care – PPO | Source: Ambulatory Visit | Attending: Family Medicine | Admitting: Family Medicine

## 2019-01-03 DIAGNOSIS — J189 Pneumonia, unspecified organism: Secondary | ICD-10-CM | POA: Diagnosis not present

## 2019-01-04 ENCOUNTER — Other Ambulatory Visit: Payer: Self-pay | Admitting: Family Medicine

## 2019-01-04 DIAGNOSIS — Z8701 Personal history of pneumonia (recurrent): Secondary | ICD-10-CM

## 2019-01-06 ENCOUNTER — Encounter: Payer: Self-pay | Admitting: Family Medicine

## 2019-01-06 ENCOUNTER — Telehealth: Payer: Self-pay | Admitting: *Deleted

## 2019-01-06 NOTE — Telephone Encounter (Signed)
Copied from [facility] [phone]. Topic: General - Inquiry >> Jan 06, 2019  9:22 AM Percell Belt A wrote: Reason for CRM: pt called in and stated she rec'd her lab work on my chart but does not understand.  She would like someone to call her to go over her last blood work and her xray she just had done. Best number [phone]

## 2019-01-07 ENCOUNTER — Other Ambulatory Visit: Payer: Self-pay | Admitting: Family Medicine

## 2019-01-07 ENCOUNTER — Other Ambulatory Visit: Payer: Self-pay

## 2019-01-07 ENCOUNTER — Ambulatory Visit (INDEPENDENT_AMBULATORY_CARE_PROVIDER_SITE_OTHER): Payer: BC Managed Care – PPO

## 2019-01-07 DIAGNOSIS — Z8701 Personal history of pneumonia (recurrent): Secondary | ICD-10-CM

## 2019-01-07 DIAGNOSIS — Z1231 Encounter for screening mammogram for malignant neoplasm of breast: Secondary | ICD-10-CM

## 2019-01-08 ENCOUNTER — Other Ambulatory Visit: Payer: Self-pay | Admitting: Family Medicine

## 2019-01-08 DIAGNOSIS — R590 Localized enlarged lymph nodes: Secondary | ICD-10-CM

## 2019-01-08 DIAGNOSIS — M549 Dorsalgia, unspecified: Secondary | ICD-10-CM

## 2019-01-08 DIAGNOSIS — D869 Sarcoidosis, unspecified: Secondary | ICD-10-CM

## 2019-01-08 DIAGNOSIS — R911 Solitary pulmonary nodule: Secondary | ICD-10-CM

## 2019-01-08 MED ORDER — TRAMADOL HCL 50 MG PO TABS: ORAL_TABLET | ORAL | 0 refills | Status: DC

## 2019-01-08 NOTE — Telephone Encounter (Signed)
Pt would like someone and call her to go over the ct scan results from yesterday also with blood work and xray

## 2019-01-08 NOTE — Telephone Encounter (Signed)
Dr. Etter Sjogren calling patient.

## 2019-01-08 NOTE — Telephone Encounter (Signed)
Dr. Etter Sjogren calling patient

## 2019-01-09 ENCOUNTER — Telehealth: Payer: Self-pay | Admitting: Family Medicine

## 2019-01-09 NOTE — Telephone Encounter (Signed)
Please advise 

## 2019-01-09 NOTE — Telephone Encounter (Signed)
Unknown at this time----hopefully short term

## 2019-01-09 NOTE — Telephone Encounter (Signed)
Curt Bears calling from pharmacy called and stated that she would like to know if pt will be using medicationtraMADol (ULTRAM) 50 MG tablet [id number]    long term or short term. Please advise

## 2019-01-13 ENCOUNTER — Encounter: Payer: Self-pay | Admitting: Family Medicine

## 2019-01-16 ENCOUNTER — Ambulatory Visit: Payer: BC Managed Care – PPO | Admitting: Internal Medicine

## 2019-01-16 ENCOUNTER — Other Ambulatory Visit: Payer: Self-pay

## 2019-01-16 ENCOUNTER — Encounter: Payer: Self-pay | Admitting: Internal Medicine

## 2019-01-16 DIAGNOSIS — R9389 Abnormal findings on diagnostic imaging of other specified body structures: Secondary | ICD-10-CM | POA: Diagnosis not present

## 2019-01-16 DIAGNOSIS — R0789 Other chest pain: Secondary | ICD-10-CM | POA: Diagnosis not present

## 2019-01-16 MED ORDER — FAMOTIDINE 20 MG PO TABS: ORAL_TABLET | ORAL | 11 refills | Status: DC

## 2019-01-16 NOTE — Patient Instructions (Addendum)
Stop prilosec and start Pepcid 20 mg after bfast and after supper.  GERD (REFLUX)  is an extremely common cause of respiratory symptoms just like yours , many times with no obvious heartburn at all.    It can be treated with medication, but also with lifestyle changes including elevation of the head of your bed (ideally with 6 -8inch blocks under the headboard of your bed),  Smoking cessation, avoidance of late meals, excessive alcohol, and avoid fatty foods, chocolate, peppermint, colas, red wine, and acidic juices such as orange juice.  NO MINT OR MENTHOL PRODUCTS SO NO COUGH DROPS  USE SUGARLESS CANDY INSTEAD (Jolley ranchers or Stover's or Life Savers) or even ice chips will also do - the key is to swallow to prevent all throat clearing. NO OIL BASED VITAMINS - use powdered substitutes.  Avoid fish oil when coughing.   Classic  pain pattern suggests this is gas pain:     limited distribution of pain locations, daytime, not usually exacerbated by exercise  or coughing, worse in sitting position, frequently associated with generalized abd bloating, not as likely to be present when your are horizontal  due to the dome effect of the diaphragm which  is  canceled in that position. Frequently these patients have had multiple negative GI workups and CT scans.  Treatment consists of avoiding foods that cause gas (especially boiled eggs, mexcican food but especially  beans and undercooked vegetables like  spinach and some salads)  and  Citrucel (white cannister with orange top) 1 heaping tsp twice daily with a large glass of water.     Please schedule a follow up office visit in 4 weeks, sooner if needed

## 2019-01-16 NOTE — Progress Notes (Signed)
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 Pulmonary Consult    Referred by Dr. Etter Sjogren for eval of CT Chest. Pt c/o mid back and right rib pain.   Dyspnea:  Not limited by breathing from desired activities   Cough: none Sleep: on side / bed is flat / 1-2 pillows never cp supine  SABA use: no    No obvious day to day or daytime variability or assoc excess/ purulent sputum or mucus plugs or hemoptysis or   chest tightness, subjective wheeze or overt sinus or hb symptoms.   Sleeping fine without nocturnal  or early am exacerbation  of respiratory  c/o's or need for noct saba. Also denies any obvious fluctuation of symptoms with weather or environmental changes or other aggravating or alleviating factors except as outlined above   No unusual exposure hx or h/o childhood pna/ asthma or knowledge of premature birth.  Current Allergies, Complete Past Medical History, Past Surgical History, Family History, and Social History were reviewed in [employer] record.  ROS  The following are not active complaints unless bolded Hoarseness, sore throat, dysphagia, dental problems, itching, sneezing,  nasal congestion or discharge of excess mucus or purulent secretions, ear ache,   fever, chills, sweats, unintended wt loss or wt gain, classically pleuritic or exertional cp,  orthopnea  pnd or arm/hand swelling  or leg swelling, presyncope, palpitations, abdominal pain, anorexia, nausea, vomiting, diarrhea  or change in bowel habits or change in bladder habits, change in stools or change in urine, dysuria, hematuria,  rash, arthralgias, visual complaints, headache, numbness, weakness or ataxia or problems with walking or coordination,  change in mood or  memory.           Past Medical History:  Diagnosis Date  . Allergy    ALSO DRY EYES  . Anemia    NOS  . 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 Melvyn Novas  . History of colonic polyps   . Hyperglycemia   . Hyperlipidemia   . Villous adenoma 1997    Outpatient Medications Prior to Visit  Medication Sig Dispense Refill  . ALPRAZolam (XANAX) 0.25 MG tablet TAKE 1 TABLET BY MOUTH ONCE DAILY AT BEDTIME AS NEEDED FOR ANXIETY 30 tablet 0  . carisoprodol (SOMA) 350 MG tablet TAKE 1/2 TO 1 (ONE-HALF TO ONE) TABLET BY MOUTH AT NIGHT AS NEEDED 30 tablet 0  . cetirizine (ZYRTEC) 10 MG tablet Take 10 mg by mouth daily as needed for allergies.    Marland Kitchen omeprazole (PRILOSEC) 20 MG capsule Take 1 capsule (20 mg total) by mouth daily. 90 capsule 3  . traMADol (ULTRAM) 50 MG tablet 1/2-1 po q6 h prn 30 tablet 0  . XIIDRA 5 % SOLN     . azithromycin (ZITHROMAX Z-PAK) 250  MG tablet As directed 6 each 0  . Diclofenac Sodium (PENNSAID) 2 % SOLN Place 1 application onto the skin 2 (two) times daily. 1 Bottle 3  . Ibuprofen-Famotidine 800-26.6 MG TABS Take 1 tablet by mouth 3 (three) times daily. (Patient not taking: Reported on 12/10/2018) 90 tablet 3      Objective:     BP 122/70 (BP Location: Left Arm, Cuff Size: Normal)   Pulse 78   Temp (!) 97 F (36.1 C) (Temporal)   Ht 5\' 4"  (1.626 m)   Wt 177 lb (80.3 kg)   LMP 03/27/2001   SpO2 98% Comment: on RA  BMI 30.38 kg/m   SpO2: 98 %(on RA)   Pleasant amb bf nad    HEENT : pt wearing mask not removed for exam due to covid -19 concerns.    NECK :  without  JVD/Nodes/TM/ nl carotid upstrokes bilaterally   LUNGS: no acc muscle use,  Nl contour chest which is clear to A and P bilaterally without cough on insp or exp maneuvers   CV:  RRR  no s3 or murmur or increase in P2, and no edema   ABD:  soft and nontender with nl inspiratory excursion in the supine position. No bruits or organomegaly appreciated, bowel sounds nl  MS:  Nl gait/ ext warm without deformities, calf tenderness, cyanosis or clubbing No obvious joint restrictions   SKIN: warm and dry with Purplish nodules on neck  Anteriorly, linear cheloid over sternum s discoloration   NEURO:  alert, approp, nl sensorium with  no motor or cerebellar deficits apparent.     Labs reviewed 01/16/2019   Angiotensin   01/21/09  = 58  Lab Results  Component Value Date   ESRSEDRATE 11 12/10/2018   ESRSEDRATE 14 01/21/2009     I personally reviewed images and agree with radiology impression as follows:  Chest CT 01/07/2019 Mediastinal adenopathy is again noted which is slightly enlarged compared to prior exam of 2010, and may simply be due to sarcoidosis or other lymphoproliferative disorder. Clinical correlation is recommended.  New 8 mm subpleural nodule is noted in right upper lobe with central calcification, most consistent with benign granuloma.  Increased irregular opacity is noted in the right lower lobe posteriorly most consistent with progressive scarring, although acute inflammation or atelectasis cannot be excluded.  Stable left posterior basilar opacity is noted most consistent with scarring.    Assessment   Abnormal CT of the chest Onset 2010  While under Dr Clayborn Heron care - no notes in epic  - Repeat 01/07/2019 with minimal increase nodes, scarring bases ? C/w burned out sarcoid    Strongly suspect sarcoid here but no indication for treatment so no indication for bx at this point.   Discussed with pt:  Sarcoidosis is a benign inflammatory condition caused by   The  immune system being too revved up like a thermostat on your furnace that's partially  stuck causing arthitis, rash, short of breath and cough and vision issues.  It typically burns itself out in 75% of patients by the end of 3 years with little to indicate that we really change the natural course of the disease by aggressive treatments intended to alter it.  Treatment is generally reserved for patients with major symptoms we can attribute to sarcoid or if a vital organ (like the eye or kidney or nervous system) becomes affected.  None of the above applies so for now will f/u serially but no bx needed  Discussed in detail all the  indications, usual  risks and alternatives  relative to the benefits with patient who agrees to proceed with conservative f/u as outlined      Atypical chest pain Onset ? Around 2016 - rx as IBS  01/16/2019 >>>   Classic subdiaphragmatic pain pattern suggests ibs:  Stereotypical, migratory with a very limited distribution of pain locations, daytime, not usually exacerbated by exercise  or coughing, worse in sitting position, frequently associated with generalized abd bloating, not as likely to be present supine due to the dome effect of the diaphragm which  is  canceled in that position. Frequently these patients have had multiple negative GI workups and CT scans.  Treatment consists of avoiding foods that cause gas (especially boiled eggs, mexcican food but especially  beans and undercooked vegetables like  spinach and some salads)  and citrucel 1 heaping tsp twice daily with a large glass of water.  Pain should improve w/in 2 weeks and if not then consider further GI work up.      Total time devoted to counseling  > 50 % of initial 60 min office visit:  review case with pt/ discussion of options/alternatives/ personally creating written customized instructions  in presence of pt  then going over those specific  Instructions directly with the pt including how to use  all of the meds but in particular covering each new medication in detail and the difference between the maintenance= "automatic" meds and the prns using an action plan format for the latter (If this problem/symptom => do that organization reading Left to right).  Please see AVS from this visit for a full list of these instructions which I personally wrote for this pt and  are unique to this visit.      Christinia Gully, MD 01/16/2019

## 2019-01-16 NOTE — Assessment & Plan Note (Addendum)
Onset 2010  While under Dr Clayborn Heron care - no notes in epic  - Repeat 01/07/2019 with minimal increase nodes, scarring bases ? C/w burned out sarcoid    Strongly suspect sarcoid here but no indication for treatment so no indication for bx at this point.   Discussed with pt:  Sarcoidosis is a benign inflammatory condition caused by  The  immune system being too revved up like a thermostat on your furnace that's partially  stuck causing arthitis, rash, short of breath and cough and vision issues.  It typically burns itself out in 75% of patients by the end of 3 years with little to indicate that we really change the natural course of the disease by aggressive treatments intended to alter it.  Treatment is generally reserved for patients with major symptoms we can attribute to sarcoid or if a vital organ (like the eye or kidney or nervous system) becomes affected.  None of the above applies so for now will f/u serially but no bx needed  Discussed in detail all the  indications, usual  risks and alternatives  relative to the benefits with patient who agrees to proceed with conservative f/u as outlined

## 2019-01-16 NOTE — Assessment & Plan Note (Signed)
Onset ? Around 2016 - rx as IBS  01/16/2019 >>>   Classic subdiaphragmatic pain pattern suggests ibs:  Stereotypical, migratory with a very limited distribution of pain locations, daytime, not usually exacerbated by exercise  or coughing, worse in sitting position, frequently associated with generalized abd bloating, not as likely to be present supine due to the dome effect of the diaphragm which  is  canceled in that position. Frequently these patients have had multiple negative GI workups and CT scans.  Treatment consists of avoiding foods that cause gas (especially boiled eggs, mexcican food but especially  beans and undercooked vegetables like  spinach and some salads)  and citrucel 1 heaping tsp twice daily with a large glass of water.  Pain should improve w/in 2 weeks and if not then consider further GI work up.      Total time devoted to counseling  > 50 % of initial 60 min office visit:  review case with pt/ discussion of options/alternatives/ personally creating written customized instructions  in presence of pt  then going over those specific  Instructions directly with the pt including how to use all of the meds but in particular covering each new medication in detail and the difference between the maintenance= "automatic" meds and the prns using an action plan format for the latter (If this problem/symptom => do that organization reading Left to right).  Please see AVS from this visit for a full list of these instructions which I personally wrote for this pt and  are unique to this visit.

## 2019-01-17 ENCOUNTER — Encounter: Payer: Self-pay | Admitting: Internal Medicine

## 2019-02-06 ENCOUNTER — Other Ambulatory Visit: Payer: Self-pay

## 2019-02-06 ENCOUNTER — Ambulatory Visit (INDEPENDENT_AMBULATORY_CARE_PROVIDER_SITE_OTHER): Payer: BC Managed Care – PPO

## 2019-02-06 DIAGNOSIS — Z1231 Encounter for screening mammogram for malignant neoplasm of breast: Secondary | ICD-10-CM | POA: Diagnosis not present

## 2019-02-12 ENCOUNTER — Ambulatory Visit: Payer: BC Managed Care – PPO | Admitting: Adult Health

## 2019-02-12 ENCOUNTER — Encounter: Payer: Self-pay | Admitting: Adult Health

## 2019-02-12 ENCOUNTER — Other Ambulatory Visit: Payer: Self-pay

## 2019-02-12 VITALS — BP 120/70 | HR 77 | Temp 97.1°F | Ht 64.0 in | Wt 179.2 lb

## 2019-02-12 DIAGNOSIS — R0789 Other chest pain: Secondary | ICD-10-CM | POA: Diagnosis not present

## 2019-02-12 DIAGNOSIS — R9389 Abnormal findings on diagnostic imaging of other specified body structures: Secondary | ICD-10-CM | POA: Diagnosis not present

## 2019-02-12 NOTE — Assessment & Plan Note (Signed)
Ongoing atypical chest pain with rib discomfort dating back to 2010 questionable etiology.  Doubt this has anything to do with her abnormal CT chest with mediastinal and hilar adenopathy. Autoimmune testing with rheumatoid factor, ANA, ESR and CRP all are negative. We will need to follow-up with primary care for further evaluation if persist

## 2019-02-12 NOTE — Assessment & Plan Note (Signed)
Mediastinal and hilar adenopathy first noted in 2010.  Most recent CT chest September 2020 showed persistent mediastinal and hilar adenopathy with slight increase over the last 10 years.  No significant parenchymal involvement except for bibasilar scarring which appears to be stable.  She has no clinical symptoms of cough or shortness of breath.  This could be indicative of previous sarcoidosis.  We will recheck an ACE level today.  And set up for pulmonary function testing with [facility].  Previously in 2010 ACE level was normal. If on return pulmonary function testing is normal and ACE level is normal would most likely to be able to follow conservatively as it does not appear she has active sarcoid involvement.  Without active symptoms doubt bronchoscopy or biopsy would be indicated As for her bilateral rib pain questionable etiology and suspect this is not related to her abnormalities on CT scanning.  Plan  Patient Instructions  Activity as tolerated.  Labs today  Return in 2 months with PFTs and As needed   Please contact office for sooner follow up if symptoms do not improve or worsen or seek emergency care

## 2019-02-12 NOTE — Patient Instructions (Signed)
Activity as tolerated.  Labs today  Return in 2 months with PFTs and As needed   Please contact office for sooner follow up if symptoms do not improve or worsen or seek emergency care

## 2019-02-12 NOTE — Progress Notes (Signed)
$'@Patient'o$  ID: [name], female    DOB: [birth year], 58 y.o.   MRN: [id number]  Chief Complaint  Patient presents with  . Follow-up    Abnormal CT     Referring provider: Ann Held, *  HPI: 58 year old female former smoker (quit 2003-social smoker) seen for pulmonary consultation on January 16 2019 for abnormal CT chest and atypical chest pain Seen March 04, 2009 for pulmonary consult for abnormal CT chest and atypical chest pain  TEST/EVENTS :  CT chest October 2010 - for PE, diffuse mediastinal and bilateral hilar adenopathy, noncalcified subcentimeter right upper lobe and left lower lobe nodules, bibasilar subsegmental atelectasis  ACE level 58/ESR 14 January 21, 2009 CT abdomen and pelvis April 2017 showed lung bases with dependent atelectasis and small nodule left lung base.  Chest and right rib films September 2020 -  negative for fracture, focal airspace opacities in the left lower lobe and right perihilar   CT chest without contrast January 08, 2019 mediastinal adenopathy again noted with slightly enlarged compared to prior CT in 2010, new 8 mm subpleural nodule right upper lobe with central calcification consistent with benign granuloma, increased right lower lobe opacity consistent with progressive scarring and stable left basilar opacity consistent with scarring   02/12/2019 Follow up: Abnormal CT chest  Patient presents for a follow-up visit.  Patient was seen for a pulmonary consult January 16, 2019.  Patient had been experiencing increased bilateral rib pain and discomfort over the last 2 to 3 months.  She was seen by her primary care provider and chest x-ray and rib films were done early September that were negative for rib fracture.  Focal opacities were seen bilaterally.  She was treated for suspected pneumonia.  Follow-up chest x-ray without significant improvement.  A subsequent CT chest was done January 08, 2019 that showed persistent mediastinal  adenopathy with slight enlargement since 2010 new 8 mm subpleural nodule along the right upper lobe with central calcification consistent with a benign granuloma and increased right lower lobe opacity consistent with possible progressive scarring and stable left basilar opacity consistent with scarring. Patient has no cough no shortness of breath or activity tolerance or change in endurance.  She works full-time. She has had ongoing bilateral rib pain and discomfort for going on greater than 10 years.  As above work-up has been unrevealing. Lab work done September 2020 showed a negative rheumatoid factor, ANA, normal ESR and CBC with differential.  LFTs were also normal.  In the past and ACE level was normal and ESR was normal in 2010 during her work-up for mediastinal and hilar adenopathy in 2010.  She has no family history of sarcoidosis. We went over her CT scan results in detail.  She was recommended last visit to use GERD treatment for possible etiology of her bilateral rib discomfort.  She says she had no significant change in symptoms.  Patient is a Pharmacist, hospital, works full-time.  Has no unusual hobbies.  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  She has no hot tub or basement.  No pets.  No bird or chicken exposure.  And has no significant travel. Medical history is been significant for hysterectomy and tonsillectomy.  She has had colon polyps with partial colectomy in the past.  No cancer history.  She is never been on methotrexate, amiodarone or Macrodantin.  She has no known history of autoimmune or connective tissue  disorders or family history that she is aware of  She does use essentials in her diffuser.  Has used CBD oil under her tongue in the past she did smoke but quit in 2003 says she was more of a social smoker smoked on and off for a few years denies any alcohol use  Allergies  Allergen Reactions  . Codeine      REACTION: GI UPSET    Immunization History  Administered Date(s) Administered  . Influenza Split 02/06/2011, 12/25/2011  . Influenza,inj,Quad PF,6+ Mos 04/07/2013, 12/10/2018  . Influenza-Unspecified 12/26/2014, 02/01/2016  . PPD Test 03/17/2013  . Td 03/27/2001    Past Medical History:  Diagnosis Date  . Allergy    ALSO DRY EYES  . Anemia    NOS  . 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 Melvyn Novas  . History of colonic polyps   . Hyperglycemia   . Hyperlipidemia   . Villous adenoma 1997    Tobacco History: Social History   Tobacco Use  Smoking Status Former Smoker  . Types: Cigarettes  . Quit date: 03/27/2001  . Years since quitting: 17.8  Smokeless Tobacco Never Used  Tobacco Comment   pt states only smoked socially and unsure how many years she smoked   Counseling given: Not Answered Comment: pt states only smoked socially and unsure how many years she smoked   Outpatient Medications Prior to Visit  Medication Sig Dispense Refill  . ALPRAZolam (XANAX) 0.25 MG tablet TAKE 1 TABLET BY MOUTH ONCE DAILY AT BEDTIME AS NEEDED FOR ANXIETY 30 tablet 0  . carisoprodol (SOMA) 350 MG tablet TAKE 1/2 TO 1 (ONE-HALF TO ONE) TABLET BY MOUTH AT NIGHT AS NEEDED 30 tablet 0  . cetirizine (ZYRTEC) 10 MG tablet Take 10 mg by mouth daily as needed for allergies.    . famotidine (PEPCID) 20 MG tablet One after breakfast and after supper 60 tablet 11  . traMADol (ULTRAM) 50 MG tablet 1/2-1 po q6 h prn 30 tablet 0  . XIIDRA 5 % SOLN      No facility-administered medications prior to visit.      Review of Systems:   Constitutional:   No  weight loss, night sweats,  Fevers, chills, fatigue, or  lassitude.  HEENT:   No headaches,  Difficulty swallowing,  Tooth/dental problems, or  Sore throat,                No sneezing, itching, ear ache, nasal congestion, post nasal drip,   CV:  No chest pain,  Orthopnea, PND, swelling in lower extremities, anasarca, dizziness,  palpitations, syncope.   GI  No heartburn, indigestion, abdominal pain, nausea, vomiting, diarrhea, change in bowel habits, loss of appetite, bloody stools.   Resp: No shortness of breath with exertion or at rest.  No excess mucus, no productive cough,  No non-productive cough,  No coughing up of blood.  No change in color of mucus.  No wheezing.  No chest wall deformity  Skin: no rash or lesions.  GU: no dysuria, change in color of urine, no urgency or frequency.  No flank pain, no hematuria   MS: Bilateral rib pain on and off for 10 years, joint stiffness occasionally no joint swelling.    Physical Exam  BP 120/70 (BP Location: Left Arm, Cuff Size: Large)   Pulse 77   Temp (!) 97.1 F (36.2 C) (Oral)   Ht '5\' 4"'$  (1.626 m)   Wt 179 lb 3.2 oz (81.3 kg)  LMP 03/27/2001   SpO2 98%   BMI 30.76 kg/m   GEN: A/Ox3; pleasant , NAD, well nourished    HEENT:  [place]/AT,   NOSE-clear, THROAT-clear, no lesions, no postnasal drip or exudate noted.   NECK:  Supple w/ fair ROM; no JVD; normal carotid impulses w/o bruits; no thyromegaly or nodules palpated; no lymphadenopathy.    RESP  Clear  P & A; w/o, wheezes/ rales/ or rhonchi. no accessory muscle use, no dullness to percussion.  Bilateral rib discomfort right greater than left.  Ribs appear normal with no obvious deformity.  No rash.  CARD:  RRR, no m/r/g, no peripheral edema, pulses intact, no cyanosis or clubbing.  GI:   Soft & nt; nml bowel sounds; no organomegaly or masses detected.   Musco: Warm bil, no deformities or joint swelling noted.   Neuro: alert, no focal deficits noted.    Skin: Warm, no lesions or rashes    Lab Results:  CBC    Component Value Date/Time   WBC 6.3 12/10/2018 1430   RBC 4.76 12/10/2018 1430   HGB 13.9 12/10/2018 1430   HCT 40.4 12/10/2018 1430   PLT 253.0 12/10/2018 1430   MCV 84.8 12/10/2018 1430   MCHC 34.5 12/10/2018 1430   RDW 14.4 12/10/2018 1430   LYMPHSABS 1.0 12/10/2018 1430    MONOABS 0.6 12/10/2018 1430   EOSABS 0.0 12/10/2018 1430   BASOSABS 0.1 12/10/2018 1430    BMET    Component Value Date/Time   NA 140 12/10/2018 1430   K 4.2 12/10/2018 1430   CL 103 12/10/2018 1430   CO2 28 12/10/2018 1430   GLUCOSE 79 12/10/2018 1430   BUN 17 12/10/2018 1430   CREATININE 1.03 12/10/2018 1430   CALCIUM 9.8 12/10/2018 1430   GFRNONAA 68.03 12/08/2008 1104    BNP No results found for: BNP  ProBNP No results found for: PROBNP  Imaging: Mm 3d Screen Breast Bilateral  Result Date: 02/07/2019 CLINICAL DATA:  Screening. EXAM: DIGITAL SCREENING BILATERAL MAMMOGRAM WITH TOMO AND CAD COMPARISON:  Previous exam(s). ACR Breast Density Category d: The breast tissue is extremely dense, which lowers the sensitivity of mammography FINDINGS: There are no findings suspicious for malignancy. Images were processed with CAD. IMPRESSION: No mammographic evidence of malignancy. A result letter of this screening mammogram will be mailed directly to the patient. RECOMMENDATION: Screening mammogram in one year. (Code:SM-B-01Y) BI-RADS CATEGORY  1: Negative. Electronically Signed   By: Everlean Alstrom M.D.   On: 02/07/2019 07:55      No flowsheet data found.  No results found for: NITRICOXIDE      Assessment & Plan:   Abnormal CT of the chest Mediastinal and hilar adenopathy first noted in 2010.  Most recent CT chest September 2020 showed persistent mediastinal and hilar adenopathy with slight increase over the last 10 years.  No significant parenchymal involvement except for bibasilar scarring which appears to be stable.  She has no clinical symptoms of cough or shortness of breath.  This could be indicative of previous sarcoidosis.  We will recheck an ACE level today.  And set up for pulmonary function testing with [facility].  Previously in 2010 ACE level was normal. If on return pulmonary function testing is normal and ACE level is normal would most likely to be able to follow  conservatively as it does not appear she has active sarcoid involvement.  Without active symptoms doubt bronchoscopy or biopsy would be indicated As for her bilateral rib pain questionable  etiology and suspect this is not related to her abnormalities on CT scanning.  Plan  Patient Instructions  Activity as tolerated.  Labs today  Return in 2 months with PFTs and As needed   Please contact office for sooner follow up if symptoms do not improve or worsen or seek emergency care       Atypical chest pain Ongoing atypical chest pain with rib discomfort dating back to 2010 questionable etiology.  Doubt this has anything to do with her abnormal CT chest with mediastinal and hilar adenopathy. Autoimmune testing with rheumatoid factor, ANA, ESR and CRP all are negative. We will need to follow-up with primary care for further evaluation if persist     Rexene Edison, NP 02/12/2019

## 2019-02-13 ENCOUNTER — Ambulatory Visit: Payer: BC Managed Care – PPO | Admitting: Internal Medicine

## 2019-02-14 LAB — ANGIOTENSIN CONVERTING ENZYME: Angiotensin-Converting Enzyme: 49 U/L (ref 9–67)

## 2019-02-15 ENCOUNTER — Other Ambulatory Visit: Payer: Self-pay | Admitting: Family Medicine

## 2019-02-15 DIAGNOSIS — M549 Dorsalgia, unspecified: Secondary | ICD-10-CM

## 2019-02-17 NOTE — Telephone Encounter (Signed)
Requesting:   Alprazolam and Soma Contract:   None UDS:   None Last Visit:   12/10/2018 Next Visit:    None Last Refill:   Alprazolam   #30 no refills on 01/02/2019                     Soma    #30 no refills on 12/10/2018 Please Advise

## 2019-02-18 ENCOUNTER — Telehealth: Payer: Self-pay | Admitting: Internal Medicine

## 2019-02-18 NOTE — Progress Notes (Signed)
Per pt's chart, she has viewed results via [place].

## 2019-02-18 NOTE — Telephone Encounter (Signed)
Called spoke with patient, discussed lab results/recs.  Patient denied any additional questions/concerns.  Nothing further needed at this time; will sign off.

## 2019-03-28 ENCOUNTER — Other Ambulatory Visit: Payer: Self-pay | Admitting: Family Medicine

## 2019-04-01 NOTE — Telephone Encounter (Signed)
Requesting: Xanax Contract: N/A UDS: N/A Last OV: 12/10/2018 Next OV: N/A Last Refill: 02/17/2019, #30--0 RF Database:   Please advise

## 2019-04-07 ENCOUNTER — Encounter: Payer: Self-pay | Admitting: Family Medicine

## 2019-04-08 ENCOUNTER — Encounter: Payer: Self-pay | Admitting: Family Medicine

## 2019-04-08 NOTE — Telephone Encounter (Signed)
Ok to give her a note saying she has sarcoidosis and this affects the immune system---- in my chart

## 2019-05-09 ENCOUNTER — Other Ambulatory Visit: Payer: Self-pay | Admitting: Family Medicine

## 2019-05-09 DIAGNOSIS — M549 Dorsalgia, unspecified: Secondary | ICD-10-CM

## 2019-05-10 ENCOUNTER — Ambulatory Visit: Payer: BC Managed Care – PPO

## 2019-05-12 ENCOUNTER — Other Ambulatory Visit: Payer: Self-pay | Admitting: Family Medicine

## 2019-05-12 DIAGNOSIS — M549 Dorsalgia, unspecified: Secondary | ICD-10-CM

## 2019-05-12 MED ORDER — CARISOPRODOL 350 MG PO TABS: ORAL_TABLET | ORAL | 0 refills | Status: DC

## 2019-05-12 NOTE — Addendum Note (Signed)
Addended by: Sharon Seller B on: 05/12/2019 12:36 PM   Modules accepted: Orders

## 2019-05-12 NOTE — Telephone Encounter (Signed)
Requesting:    Alprazolam Contract:   None UDS:   None Last Visit:    12/10/2018 Next Visit:    None scheduled yet Last Refill:   04/01/2019--#30 no refills  Please Advise

## 2019-05-12 NOTE — Telephone Encounter (Signed)
This has not been refilled today as it may look like. I try to fill fogetting it was controlled.

## 2019-05-18 ENCOUNTER — Encounter: Payer: Self-pay | Admitting: Family Medicine

## 2019-05-24 ENCOUNTER — Ambulatory Visit: Payer: BC Managed Care – PPO

## 2019-06-06 ENCOUNTER — Other Ambulatory Visit: Payer: BC Managed Care – PPO

## 2019-06-16 ENCOUNTER — Other Ambulatory Visit: Payer: Self-pay | Admitting: Family Medicine

## 2019-06-17 NOTE — Telephone Encounter (Signed)
Requesting:xanax Contract:no UDS:n/a Last OV:12/10/18 Next OV:n/a Last Refill:05/12/19 #30-0rf Database:   Please advise

## 2019-07-29 ENCOUNTER — Telehealth: Payer: Self-pay | Admitting: Family Medicine

## 2019-07-29 DIAGNOSIS — R1013 Epigastric pain: Secondary | ICD-10-CM

## 2019-07-30 NOTE — Telephone Encounter (Signed)
PDMP okay, prescription typically refilled by PCP, Rx sent

## 2019-07-30 NOTE — Telephone Encounter (Signed)
Requesting: Xanax Contract: N/A UDS: N/A Last OV: 12/10/2018 Next OV: N/A Last Refill: 06/17/19, #30--0 RF Database:   Please advise

## 2019-09-18 ENCOUNTER — Ambulatory Visit: Payer: BC Managed Care – PPO | Admitting: Family Medicine

## 2019-09-18 ENCOUNTER — Other Ambulatory Visit: Payer: Self-pay

## 2019-09-18 ENCOUNTER — Encounter: Payer: Self-pay | Admitting: Family Medicine

## 2019-09-18 VITALS — BP 145/76 | HR 79 | Temp 97.2°F | Resp 16 | Ht 64.0 in | Wt 173.0 lb

## 2019-09-18 DIAGNOSIS — R030 Elevated blood-pressure reading, without diagnosis of hypertension: Secondary | ICD-10-CM | POA: Diagnosis not present

## 2019-09-18 LAB — COMPREHENSIVE METABOLIC PANEL
ALT: 24 U/L (ref 0–35)
AST: 21 U/L (ref 0–37)
Albumin: 4.4 g/dL (ref 3.5–5.2)
Alkaline Phosphatase: 59 U/L (ref 39–117)
BUN: 18 mg/dL (ref 6–23)
CO2: 29 mEq/L (ref 19–32)
Calcium: 9.9 mg/dL (ref 8.4–10.5)
Chloride: 101 mEq/L (ref 96–112)
Creatinine, Ser: 1.1 mg/dL (ref 0.40–1.20)
GFR: 61.45 mL/min (ref >60.00)
Glucose, Bld: 88 mg/dL (ref 70–99)
Potassium: 4.4 mEq/L (ref 3.5–5.1)
Sodium: 139 mEq/L (ref 135–145)
Total Bilirubin: 1.3 mg/dL — ABNORMAL HIGH (ref 0.2–1.2)
Total Protein: 7.4 g/dL (ref 6.0–8.3)

## 2019-09-18 LAB — CBC WITH DIFFERENTIAL/PLATELET
Basophils Absolute: 0 10*3/uL (ref 0.0–0.1)
Basophils Relative: 0.8 % (ref 0.0–3.0)
Eosinophils Absolute: 0.1 10*3/uL (ref 0.0–0.7)
Eosinophils Relative: 1.7 % (ref 0.0–5.0)
HCT: 41.1 % (ref 36.0–46.0)
Hemoglobin: 14.3 g/dL (ref 12.0–15.0)
Lymphocytes Relative: 20.6 % (ref 12.0–46.0)
Lymphs Abs: 1.2 10*3/uL (ref 0.7–4.0)
MCHC: 34.7 g/dL (ref 30.0–36.0)
MCV: 84.4 fl (ref 78.0–100.0)
Monocytes Absolute: 0.7 10*3/uL (ref 0.1–1.0)
Monocytes Relative: 11.7 % (ref 3.0–12.0)
Neutro Abs: 3.7 10*3/uL (ref 1.4–7.7)
Neutrophils Relative %: 65.2 % (ref 43.0–77.0)
Platelets: 254 10*3/uL (ref 150.0–400.0)
RBC: 4.88 Mil/uL (ref 3.87–5.11)
RDW: 14.4 % (ref 11.5–15.5)
WBC: 5.6 10*3/uL (ref 4.0–10.5)

## 2019-09-18 LAB — LIPID PANEL
Cholesterol: 275 mg/dL — ABNORMAL HIGH (ref 0–200)
HDL: 74.7 mg/dL (ref >39.00)
LDL Cholesterol: 188 mg/dL — ABNORMAL HIGH (ref 0–99)
NonHDL: 200.54
Total CHOL/HDL Ratio: 4
Triglycerides: 62 mg/dL (ref 0.0–149.0)
VLDL: 12.4 mg/dL (ref 0.0–40.0)

## 2019-09-18 NOTE — Patient Instructions (Signed)
DASH Eating Plan DASH stands for "Dietary Approaches to Stop Hypertension." The DASH eating plan is a healthy eating plan that has been shown to reduce high blood pressure (hypertension). It may also reduce your risk for type 2 diabetes, heart disease, and stroke. The DASH eating plan may also help with weight loss. What are tips for following this plan?  General guidelines  Avoid eating more than 2,300 mg (milligrams) of salt (sodium) a day. If you have hypertension, you may need to reduce your sodium intake to 1,500 mg a day.  Limit alcohol intake to no more than 1 drink a day for nonpregnant women and 2 drinks a day for men. One drink equals 12 oz of beer, 5 oz of wine, or 1 oz of hard liquor.  Work with your health care provider to maintain a healthy body weight or to lose weight. Ask what an ideal weight is for you.  Get at least 30 minutes of exercise that causes your heart to beat faster (aerobic exercise) most days of the week. Activities may include walking, swimming, or biking.  Work with your health care provider or diet and nutrition specialist (dietitian) to adjust your eating plan to your individual calorie needs. Reading food labels   Check food labels for the amount of sodium per serving. Choose foods with less than 5 percent of the Daily Value of sodium. Generally, foods with less than 300 mg of sodium per serving fit into this eating plan.  To find whole grains, look for the word "whole" as the first word in the ingredient list. Shopping  Buy products labeled as "low-sodium" or "no salt added."  Buy fresh foods. Avoid canned foods and premade or frozen meals. Cooking  Avoid adding salt when cooking. Use salt-free seasonings or herbs instead of table salt or sea salt. Check with your health care provider or pharmacist before using salt substitutes.  Do not fry foods. Cook foods using healthy methods such as baking, boiling, grilling, and broiling instead.  Cook with  heart-healthy oils, such as olive, canola, soybean, or sunflower oil. Meal planning  Eat a balanced diet that includes: ? 5 or more servings of fruits and vegetables each day. At each meal, try to fill half of your plate with fruits and vegetables. ? Up to 6-8 servings of whole grains each day. ? Less than 6 oz of lean meat, poultry, or fish each day. A 3-oz serving of meat is about the same size as a deck of cards. One egg equals 1 oz. ? 2 servings of low-fat dairy each day. ? A serving of nuts, seeds, or beans 5 times each week. ? Heart-healthy fats. Healthy fats called Omega-3 fatty acids are found in foods such as flaxseeds and coldwater fish, like sardines, salmon, and mackerel.  Limit how much you eat of the following: ? Canned or prepackaged foods. ? Food that is high in trans fat, such as fried foods. ? Food that is high in saturated fat, such as fatty meat. ? Sweets, desserts, sugary drinks, and other foods with added sugar. ? Full-fat dairy products.  Do not salt foods before eating.  Try to eat at least 2 vegetarian meals each week.  Eat more home-cooked food and less restaurant, buffet, and fast food.  When eating at a restaurant, ask that your food be prepared with less salt or no salt, if possible. What foods are recommended? The items listed may not be a complete list. Talk with your dietitian about   what dietary choices are best for you. Grains Whole-grain or whole-wheat bread. Whole-grain or whole-wheat pasta. Brown rice. Oatmeal. Quinoa. Bulgur. Whole-grain and low-sodium cereals. Pita bread. Low-fat, low-sodium crackers. Whole-wheat flour tortillas. Vegetables Fresh or frozen vegetables (raw, steamed, roasted, or grilled). Low-sodium or reduced-sodium tomato and vegetable juice. Low-sodium or reduced-sodium tomato sauce and tomato paste. Low-sodium or reduced-sodium canned vegetables. Fruits All fresh, dried, or frozen fruit. Canned fruit in natural juice (without  added sugar). Meat and other protein foods Skinless chicken or turkey. Ground chicken or turkey. Pork with fat trimmed off. Fish and seafood. Egg whites. Dried beans, peas, or lentils. Unsalted nuts, nut butters, and seeds. Unsalted canned beans. Lean cuts of beef with fat trimmed off. Low-sodium, lean deli meat. Dairy Low-fat (1%) or fat-free (skim) milk. Fat-free, low-fat, or reduced-fat cheeses. Nonfat, low-sodium ricotta or cottage cheese. Low-fat or nonfat yogurt. Low-fat, low-sodium cheese. Fats and oils Soft margarine without trans fats. Vegetable oil. Low-fat, reduced-fat, or light mayonnaise and salad dressings (reduced-sodium). Canola, safflower, olive, soybean, and sunflower oils. Avocado. Seasoning and other foods Herbs. Spices. Seasoning mixes without salt. Unsalted popcorn and pretzels. Fat-free sweets. What foods are not recommended? The items listed may not be a complete list. Talk with your dietitian about what dietary choices are best for you. Grains Baked goods made with fat, such as croissants, muffins, or some breads. Dry pasta or rice meal packs. Vegetables Creamed or fried vegetables. Vegetables in a cheese sauce. Regular canned vegetables (not low-sodium or reduced-sodium). Regular canned tomato sauce and paste (not low-sodium or reduced-sodium). Regular tomato and vegetable juice (not low-sodium or reduced-sodium). Pickles. Olives. Fruits Canned fruit in a light or heavy syrup. Fried fruit. Fruit in cream or butter sauce. Meat and other protein foods Fatty cuts of meat. Ribs. Fried meat. Bacon. Sausage. Bologna and other processed lunch meats. Salami. Fatback. Hotdogs. Bratwurst. Salted nuts and seeds. Canned beans with added salt. Canned or smoked fish. Whole eggs or egg yolks. Chicken or turkey with skin. Dairy Whole or 2% milk, cream, and half-and-half. Whole or full-fat cream cheese. Whole-fat or sweetened yogurt. Full-fat cheese. Nondairy creamers. Whipped toppings.  Processed cheese and cheese spreads. Fats and oils Butter. Stick margarine. Lard. Shortening. Ghee. Bacon fat. Tropical oils, such as coconut, palm kernel, or palm oil. Seasoning and other foods Salted popcorn and pretzels. Onion salt, garlic salt, seasoned salt, table salt, and sea salt. Worcestershire sauce. Tartar sauce. Barbecue sauce. Teriyaki sauce. Soy sauce, including reduced-sodium. Steak sauce. Canned and packaged gravies. Fish sauce. Oyster sauce. Cocktail sauce. Horseradish that you find on the shelf. Ketchup. Mustard. Meat flavorings and tenderizers. Bouillon cubes. Hot sauce and Tabasco sauce. Premade or packaged marinades. Premade or packaged taco seasonings. Relishes. Regular salad dressings. Where to find more information:  National Heart, Lung, and Blood Institute: www.nhlbi.nih.gov  American Heart Association: www.heart.org Summary  The DASH eating plan is a healthy eating plan that has been shown to reduce high blood pressure (hypertension). It may also reduce your risk for type 2 diabetes, heart disease, and stroke.  With the DASH eating plan, you should limit salt (sodium) intake to 2,300 mg a day. If you have hypertension, you may need to reduce your sodium intake to 1,500 mg a day.  When on the DASH eating plan, aim to eat more fresh fruits and vegetables, whole grains, lean proteins, low-fat dairy, and heart-healthy fats.  Work with your health care provider or diet and nutrition specialist (dietitian) to adjust your eating plan to your   individual calorie needs. This information is not intended to replace advice given to you by your health care provider. Make sure you discuss any questions you have with your health care provider. Document Revised: 02/23/2017 Document Reviewed: 03/06/2016 Elsevier Patient Education  2020 Elsevier Inc.  

## 2019-09-18 NOTE — Assessment & Plan Note (Signed)
Dash diet  Get your own bp cuff and f/u In office in 2-3 weeks  Or sooner prn

## 2019-09-18 NOTE — Progress Notes (Signed)
Patient ID: [name], female    DOB: 07-Feb-1961  Age: 59 y.o. MRN: [id number]    Subjective:  Subjective  HPI TAMICO MUNDO presents for bp f/u.  Her bp has been running high at pharmacy and other doc app.     Review of Systems  Constitutional: Negative for appetite change, diaphoresis, fatigue and unexpected weight change.  Eyes: Negative for pain, redness and visual disturbance.  Respiratory: Negative for cough, chest tightness, shortness of breath and wheezing.   Cardiovascular: Negative for chest pain, palpitations and leg swelling.  Endocrine: Negative for cold intolerance, heat intolerance, polydipsia, polyphagia and polyuria.  Genitourinary: Negative for difficulty urinating, dysuria and frequency.  Neurological: Negative for dizziness, light-headedness, numbness and headaches.    History Past Medical History:  Diagnosis Date  . Allergy    ALSO DRY EYES  . Anemia    NOS  . 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 Melvyn Novas  . History of colonic polyps   . Hyperglycemia   . Hyperlipidemia   . Villous adenoma 1997    She has a past surgical history that includes Abdominal hysterectomy; Fetal blood transfusion (03/2001); Tonsillectomy; Partial colectomy (1997); and Colonoscopy.   Her family history includes Asthma in her maternal grandmother; Colon cancer in her maternal aunt; Colon cancer (age of onset: 75) in her father; Diabetes in her paternal uncle; Hyperlipidemia in her brother; Hypertension in her brother and mother; Leukemia in her father.She reports that she quit smoking about 18 years ago. Her smoking use included cigarettes. She has never used smokeless tobacco. She reports that she does not drink alcohol. No history on file for drug use.  Current Outpatient Medications on File Prior to Visit  Medication Sig Dispense Refill  . ALPRAZolam (XANAX) 0.25 MG tablet TAKE 1 TABLET BY MOUTH ONCE DAILY AT BEDTIME AS NEEDED FOR ANXIETY 30 tablet 0  .  carisoprodol (SOMA) 350 MG tablet Take 1/2 to 1 tablet by mouth at night as needed. 30 tablet 0  . cetirizine (ZYRTEC) 10 MG tablet Take 10 mg by mouth daily as needed for allergies.    . famotidine (PEPCID) 20 MG tablet One after breakfast and after supper 60 tablet 11  . traMADol (ULTRAM) 50 MG tablet 1/2-1 po q6 h prn 30 tablet 0   No current facility-administered medications on file prior to visit.     Objective:  Objective  Physical Exam Vitals and nursing note reviewed.  Constitutional:      Appearance: She is well-developed.  HENT:     Head: Normocephalic and atraumatic.  Eyes:     Conjunctiva/sclera: Conjunctivae normal.  Neck:     Thyroid: No thyromegaly.     Vascular: No carotid bruit or JVD.  Cardiovascular:     Rate and Rhythm: Normal rate and regular rhythm.     Heart sounds: Normal heart sounds. No murmur heard.   Pulmonary:     Effort: Pulmonary effort is normal. No respiratory distress.     Breath sounds: Normal breath sounds. No wheezing or rales.  Chest:     Chest wall: No tenderness.  Musculoskeletal:     Cervical back: Normal range of motion and neck supple.  Neurological:     Mental Status: She is alert and oriented to person, place, and time.    BP (!) 145/76 (BP Location: Left Arm, Patient Position: Sitting, Cuff Size: Small)   Pulse 79   Temp (!) 97.2 F (36.2 C) (Temporal)   Resp 16  Ht 5\' 4"  (1.626 m)   Wt 173 lb (78.5 kg)   LMP 03/27/2001   SpO2 99%   BMI 29.70 kg/m  Wt Readings from Last 3 Encounters:  09/18/19 173 lb (78.5 kg)  02/12/19 179 lb 3.2 oz (81.3 kg)  01/16/19 177 lb (80.3 kg)     Lab Results  Component Value Date   WBC 5.6 09/18/2019   HGB 14.3 09/18/2019   HCT 41.1 09/18/2019   PLT 254.0 09/18/2019   GLUCOSE 88 09/18/2019   CHOL 275 (H) 09/18/2019   TRIG 62.0 09/18/2019   HDL 74.70 09/18/2019   LDLDIRECT 145.6 12/08/2008   LDLCALC 188 (H) 09/18/2019   ALT 24 09/18/2019   AST 21 09/18/2019   NA 139 09/18/2019    K 4.4 09/18/2019   CL 101 09/18/2019   CREATININE 1.10 09/18/2019   BUN 18 09/18/2019   CO2 29 09/18/2019   TSH 1.50 12/10/2018    DG Chest 2 View  Result Date: 01/03/2019 CLINICAL DATA:  Pneumonia follow-up EXAM: CHEST - 2 VIEW COMPARISON:  12/10/2018 FINDINGS: There is a persistent right-sided infrahilar/perihilar airspace opacity which has not improved since prior study. There is a persistent opacity at the left lung base with blunting of the left costophrenic angle. There is no pneumothorax or new pulmonary opacity. IMPRESSION: Persistent unchanged opacities in the right perihilar region and at the left lung base. Follow-up with CT is recommended given limited interval change. Electronically Signed   By: Constance Holster M.D.   On: 01/03/2019 19:27     Assessment & Plan:  Plan  I have discontinued Zulay L. Spinnato's Xiidra. I am also having her maintain her cetirizine, traMADol, famotidine, carisoprodol, and ALPRAZolam.  No orders of the defined types were placed in this encounter.   Problem List Items Addressed This Visit      Unprioritized   Elevated BP without diagnosis of hypertension - Primary    Dash diet  Get your own bp cuff and f/u In office in 2-3 weeks  Or sooner prn       Relevant Orders   Lipid panel (Completed)   CBC with Differential/Platelet (Completed)   Comprehensive metabolic panel (Completed)      Follow-up: Return in about 3 weeks (around 10/09/2019), or if symptoms worsen or fail to improve, for to recheck bp.  Ann Held, DO

## 2019-09-19 ENCOUNTER — Encounter: Payer: Self-pay | Admitting: Family Medicine

## 2019-09-19 ENCOUNTER — Other Ambulatory Visit: Payer: Self-pay | Admitting: Family Medicine

## 2019-09-19 DIAGNOSIS — E785 Hyperlipidemia, unspecified: Secondary | ICD-10-CM

## 2019-09-24 ENCOUNTER — Encounter: Payer: Self-pay | Admitting: Internal Medicine

## 2019-10-06 ENCOUNTER — Other Ambulatory Visit: Payer: Self-pay | Admitting: Family Medicine

## 2019-10-06 DIAGNOSIS — M549 Dorsalgia, unspecified: Secondary | ICD-10-CM

## 2019-10-07 NOTE — Telephone Encounter (Signed)
Requesting: Soma Contract: N/A UDS: N/A Last OV: 09/18/2019 Next OV: 10/09/2019 Last Refill: 05/12/2019, #30--0 RF Database:   Please advise

## 2019-10-09 ENCOUNTER — Encounter: Payer: Self-pay | Admitting: Family Medicine

## 2019-10-09 ENCOUNTER — Ambulatory Visit: Payer: BC Managed Care – PPO | Admitting: Family Medicine

## 2019-10-09 ENCOUNTER — Other Ambulatory Visit: Payer: Self-pay

## 2019-10-09 VITALS — BP 124/80 | HR 75 | Temp 97.8°F | Resp 18 | Ht 64.0 in | Wt 171.8 lb

## 2019-10-09 DIAGNOSIS — I10 Essential (primary) hypertension: Secondary | ICD-10-CM | POA: Insufficient documentation

## 2019-10-09 DIAGNOSIS — E2839 Other primary ovarian failure: Secondary | ICD-10-CM | POA: Diagnosis not present

## 2019-10-09 DIAGNOSIS — R5383 Other fatigue: Secondary | ICD-10-CM | POA: Diagnosis not present

## 2019-10-09 NOTE — Assessment & Plan Note (Signed)
Well controlled, no changes to meds. Encouraged heart healthy diet such as the DASH diet and exercise as tolerated.  °

## 2019-10-09 NOTE — Patient Instructions (Signed)
DASH Eating Plan DASH stands for "Dietary Approaches to Stop Hypertension." The DASH eating plan is a healthy eating plan that has been shown to reduce high blood pressure (hypertension). It may also reduce your risk for type 2 diabetes, heart disease, and stroke. The DASH eating plan may also help with weight loss. What are tips for following this plan?  General guidelines  Avoid eating more than 2,300 mg (milligrams) of salt (sodium) a day. If you have hypertension, you may need to reduce your sodium intake to 1,500 mg a day.  Limit alcohol intake to no more than 1 drink a day for nonpregnant women and 2 drinks a day for men. One drink equals 12 oz of beer, 5 oz of wine, or 1 oz of hard liquor.  Work with your health care provider to maintain a healthy body weight or to lose weight. Ask what an ideal weight is for you.  Get at least 30 minutes of exercise that causes your heart to beat faster (aerobic exercise) most days of the week. Activities may include walking, swimming, or biking.  Work with your health care provider or diet and nutrition specialist (dietitian) to adjust your eating plan to your individual calorie needs. Reading food labels   Check food labels for the amount of sodium per serving. Choose foods with less than 5 percent of the Daily Value of sodium. Generally, foods with less than 300 mg of sodium per serving fit into this eating plan.  To find whole grains, look for the word "whole" as the first word in the ingredient list. Shopping  Buy products labeled as "low-sodium" or "no salt added."  Buy fresh foods. Avoid canned foods and premade or frozen meals. Cooking  Avoid adding salt when cooking. Use salt-free seasonings or herbs instead of table salt or sea salt. Check with your health care provider or pharmacist before using salt substitutes.  Do not fry foods. Cook foods using healthy methods such as baking, boiling, grilling, and broiling instead.  Cook with  heart-healthy oils, such as olive, canola, soybean, or sunflower oil. Meal planning  Eat a balanced diet that includes: ? 5 or more servings of fruits and vegetables each day. At each meal, try to fill half of your plate with fruits and vegetables. ? Up to 6-8 servings of whole grains each day. ? Less than 6 oz of lean meat, poultry, or fish each day. A 3-oz serving of meat is about the same size as a deck of cards. One egg equals 1 oz. ? 2 servings of low-fat dairy each day. ? A serving of nuts, seeds, or beans 5 times each week. ? Heart-healthy fats. Healthy fats called Omega-3 fatty acids are found in foods such as flaxseeds and coldwater fish, like sardines, salmon, and mackerel.  Limit how much you eat of the following: ? Canned or prepackaged foods. ? Food that is high in trans fat, such as fried foods. ? Food that is high in saturated fat, such as fatty meat. ? Sweets, desserts, sugary drinks, and other foods with added sugar. ? Full-fat dairy products.  Do not salt foods before eating.  Try to eat at least 2 vegetarian meals each week.  Eat more home-cooked food and less restaurant, buffet, and fast food.  When eating at a restaurant, ask that your food be prepared with less salt or no salt, if possible. What foods are recommended? The items listed may not be a complete list. Talk with your dietitian about   what dietary choices are best for you. Grains Whole-grain or whole-wheat bread. Whole-grain or whole-wheat pasta. Brown rice. Oatmeal. Quinoa. Bulgur. Whole-grain and low-sodium cereals. Pita bread. Low-fat, low-sodium crackers. Whole-wheat flour tortillas. Vegetables Fresh or frozen vegetables (raw, steamed, roasted, or grilled). Low-sodium or reduced-sodium tomato and vegetable juice. Low-sodium or reduced-sodium tomato sauce and tomato paste. Low-sodium or reduced-sodium canned vegetables. Fruits All fresh, dried, or frozen fruit. Canned fruit in natural juice (without  added sugar). Meat and other protein foods Skinless chicken or turkey. Ground chicken or turkey. Pork with fat trimmed off. Fish and seafood. Egg whites. Dried beans, peas, or lentils. Unsalted nuts, nut butters, and seeds. Unsalted canned beans. Lean cuts of beef with fat trimmed off. Low-sodium, lean deli meat. Dairy Low-fat (1%) or fat-free (skim) milk. Fat-free, low-fat, or reduced-fat cheeses. Nonfat, low-sodium ricotta or cottage cheese. Low-fat or nonfat yogurt. Low-fat, low-sodium cheese. Fats and oils Soft margarine without trans fats. Vegetable oil. Low-fat, reduced-fat, or light mayonnaise and salad dressings (reduced-sodium). Canola, safflower, olive, soybean, and sunflower oils. Avocado. Seasoning and other foods Herbs. Spices. Seasoning mixes without salt. Unsalted popcorn and pretzels. Fat-free sweets. What foods are not recommended? The items listed may not be a complete list. Talk with your dietitian about what dietary choices are best for you. Grains Baked goods made with fat, such as croissants, muffins, or some breads. Dry pasta or rice meal packs. Vegetables Creamed or fried vegetables. Vegetables in a cheese sauce. Regular canned vegetables (not low-sodium or reduced-sodium). Regular canned tomato sauce and paste (not low-sodium or reduced-sodium). Regular tomato and vegetable juice (not low-sodium or reduced-sodium). Pickles. Olives. Fruits Canned fruit in a light or heavy syrup. Fried fruit. Fruit in cream or butter sauce. Meat and other protein foods Fatty cuts of meat. Ribs. Fried meat. Bacon. Sausage. Bologna and other processed lunch meats. Salami. Fatback. Hotdogs. Bratwurst. Salted nuts and seeds. Canned beans with added salt. Canned or smoked fish. Whole eggs or egg yolks. Chicken or turkey with skin. Dairy Whole or 2% milk, cream, and half-and-half. Whole or full-fat cream cheese. Whole-fat or sweetened yogurt. Full-fat cheese. Nondairy creamers. Whipped toppings.  Processed cheese and cheese spreads. Fats and oils Butter. Stick margarine. Lard. Shortening. Ghee. Bacon fat. Tropical oils, such as coconut, palm kernel, or palm oil. Seasoning and other foods Salted popcorn and pretzels. Onion salt, garlic salt, seasoned salt, table salt, and sea salt. Worcestershire sauce. Tartar sauce. Barbecue sauce. Teriyaki sauce. Soy sauce, including reduced-sodium. Steak sauce. Canned and packaged gravies. Fish sauce. Oyster sauce. Cocktail sauce. Horseradish that you find on the shelf. Ketchup. Mustard. Meat flavorings and tenderizers. Bouillon cubes. Hot sauce and Tabasco sauce. Premade or packaged marinades. Premade or packaged taco seasonings. Relishes. Regular salad dressings. Where to find more information:  National Heart, Lung, and Blood Institute: www.nhlbi.nih.gov  American Heart Association: www.heart.org Summary  The DASH eating plan is a healthy eating plan that has been shown to reduce high blood pressure (hypertension). It may also reduce your risk for type 2 diabetes, heart disease, and stroke.  With the DASH eating plan, you should limit salt (sodium) intake to 2,300 mg a day. If you have hypertension, you may need to reduce your sodium intake to 1,500 mg a day.  When on the DASH eating plan, aim to eat more fresh fruits and vegetables, whole grains, lean proteins, low-fat dairy, and heart-healthy fats.  Work with your health care provider or diet and nutrition specialist (dietitian) to adjust your eating plan to your   individual calorie needs. This information is not intended to replace advice given to you by your health care provider. Make sure you discuss any questions you have with your health care provider. Document Revised: 02/23/2017 Document Reviewed: 03/06/2016 Elsevier Patient Education  2020 Elsevier Inc.  

## 2019-10-09 NOTE — Assessment & Plan Note (Signed)
Pt requesting b12 be checked  \will check at next ov

## 2019-10-09 NOTE — Progress Notes (Signed)
Patient ID: [name], female    DOB: Jul 12, 1960  Age: 59 y.o. MRN: [id number]    Subjective:  Subjective  HPI [name] presents for f/u bp,   No complaints   Review of Systems  Constitutional: Negative for appetite change, diaphoresis, fatigue and unexpected weight change.  Eyes: Negative for pain, redness and visual disturbance.  Respiratory: Negative for cough, chest tightness, shortness of breath and wheezing.   Cardiovascular: Negative for chest pain, palpitations and leg swelling.  Endocrine: Negative for cold intolerance, heat intolerance, polydipsia, polyphagia and polyuria.  Genitourinary: Negative for difficulty urinating, dysuria and frequency.  Neurological: Negative for dizziness, light-headedness, numbness and headaches.    History Past Medical History:  Diagnosis Date  . Allergy    ALSO DRY EYES  . Anemia    NOS  . 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 Melvyn Novas  . History of colonic polyps   . Hyperglycemia   . Hyperlipidemia   . Villous adenoma 1997    She has a past surgical history that includes Abdominal hysterectomy; Fetal blood transfusion (03/2001); Tonsillectomy; Partial colectomy (1997); and Colonoscopy.   Her family history includes Asthma in her maternal grandmother; Colon cancer in her maternal aunt; Colon cancer (age of onset: 67) in her father; Diabetes in her paternal uncle; Hyperlipidemia in her brother; Hypertension in her brother and mother; Leukemia in her father.She reports that she quit smoking about 18 years ago. Her smoking use included cigarettes. She has never used smokeless tobacco. She reports that she does not drink alcohol. No history on file for drug use.  Current Outpatient Medications on File Prior to Visit  Medication Sig Dispense Refill  . ALPRAZolam (XANAX) 0.25 MG tablet TAKE 1 TABLET BY MOUTH ONCE DAILY AT BEDTIME AS NEEDED FOR ANXIETY 30 tablet 0  . carisoprodol (SOMA) 350 MG tablet TAKE 1/2 TO 1  (ONE-HALF TO ONE) TABLET BY MOUTH AT NIGHT AS NEEDED 30 tablet 0  . cetirizine (ZYRTEC) 10 MG tablet Take 10 mg by mouth daily as needed for allergies.    . famotidine (PEPCID) 20 MG tablet One after breakfast and after supper 60 tablet 11  . traMADol (ULTRAM) 50 MG tablet 1/2-1 po q6 h prn 30 tablet 0   No current facility-administered medications on file prior to visit.     Objective:  Objective  Physical Exam Vitals and nursing note reviewed.  Constitutional:      Appearance: She is well-developed.  HENT:     Head: Normocephalic and atraumatic.  Eyes:     Conjunctiva/sclera: Conjunctivae normal.  Neck:     Thyroid: No thyromegaly.     Vascular: No carotid bruit or JVD.  Cardiovascular:     Rate and Rhythm: Normal rate and regular rhythm.     Heart sounds: Normal heart sounds. No murmur heard.   Pulmonary:     Effort: Pulmonary effort is normal. No respiratory distress.     Breath sounds: Normal breath sounds. No wheezing or rales.  Chest:     Chest wall: No tenderness.  Musculoskeletal:     Cervical back: Normal range of motion and neck supple.  Neurological:     Mental Status: She is alert and oriented to person, place, and time.    BP 124/80 (BP Location: Right Arm, Patient Position: Sitting, Cuff Size: Normal)   Pulse 75   Temp 97.8 F (36.6 C) (Oral)   Resp 18   Ht 5\' 4"  (1.626 m)   Wt  171 lb 12.8 oz (77.9 kg)   LMP 03/27/2001   SpO2 98%   BMI 29.49 kg/m  Wt Readings from Last 3 Encounters:  10/09/19 171 lb 12.8 oz (77.9 kg)  09/18/19 173 lb (78.5 kg)  02/12/19 179 lb 3.2 oz (81.3 kg)     Lab Results  Component Value Date   WBC 5.6 09/18/2019   HGB 14.3 09/18/2019   HCT 41.1 09/18/2019   PLT 254.0 09/18/2019   GLUCOSE 88 09/18/2019   CHOL 275 (H) 09/18/2019   TRIG 62.0 09/18/2019   HDL 74.70 09/18/2019   LDLDIRECT 145.6 12/08/2008   LDLCALC 188 (H) 09/18/2019   ALT 24 09/18/2019   AST 21 09/18/2019   NA 139 09/18/2019   K 4.4 09/18/2019    CL 101 09/18/2019   CREATININE 1.10 09/18/2019   BUN 18 09/18/2019   CO2 29 09/18/2019   TSH 1.50 12/10/2018    DG Chest 2 View  Result Date: 01/03/2019 CLINICAL DATA:  Pneumonia follow-up EXAM: CHEST - 2 VIEW COMPARISON:  12/10/2018 FINDINGS: There is a persistent right-sided infrahilar/perihilar airspace opacity which has not improved since prior study. There is a persistent opacity at the left lung base with blunting of the left costophrenic angle. There is no pneumothorax or new pulmonary opacity. IMPRESSION: Persistent unchanged opacities in the right perihilar region and at the left lung base. Follow-up with CT is recommended given limited interval change. Electronically Signed   By: Constance Holster M.D.   On: 01/03/2019 19:27     Assessment & Plan:  Plan  I am having [name] maintain her cetirizine, traMADol, famotidine, ALPRAZolam, and carisoprodol.  No orders of the defined types were placed in this encounter.   Problem List Items Addressed This Visit      Unprioritized   Essential hypertension - Primary    Well controlled, no changes to meds. Encouraged heart healthy diet such as the DASH diet and exercise as tolerated.       Other fatigue    Pt requesting b12 be checked  \will check at next ov      Relevant Orders   Vitamin B12    Other Visit Diagnoses    Estrogen deficiency       Relevant Orders   DG Bone Density      Follow-up: Return in about 2 months (around 12/10/2019) for hypertension--- lab only .  Ann Held, DO

## 2019-10-21 ENCOUNTER — Other Ambulatory Visit: Payer: Self-pay | Admitting: Internal Medicine

## 2019-10-22 NOTE — Telephone Encounter (Signed)
Alprazolam refill.   Last OV: 10/09/2019 Last Fill: 07/30/2019 #30 and 0RF Pt sig: 1 tab qhs prn UDS: None

## 2019-11-12 ENCOUNTER — Ambulatory Visit (AMBULATORY_SURGERY_CENTER): Payer: Self-pay

## 2019-11-12 ENCOUNTER — Other Ambulatory Visit: Payer: Self-pay

## 2019-11-12 ENCOUNTER — Encounter: Payer: Self-pay | Admitting: Internal Medicine

## 2019-11-12 VITALS — Ht 64.0 in | Wt 172.2 lb

## 2019-11-12 DIAGNOSIS — Z800 Family history of malignant neoplasm of digestive organs: Secondary | ICD-10-CM

## 2019-11-12 DIAGNOSIS — Z86010 Personal history of colonic polyps: Secondary | ICD-10-CM

## 2019-11-12 NOTE — Progress Notes (Signed)
No allergies to soy or egg Pt is not on blood thinners or diet pills Denies issues with sedation/intubation Denies atrial flutter/fib Denies constipation   Emmi instructions given to pt  Pt is aware of Covid safety and care partner requirements.  

## 2019-11-26 ENCOUNTER — Other Ambulatory Visit: Payer: Self-pay

## 2019-11-26 ENCOUNTER — Encounter: Payer: Self-pay | Admitting: Internal Medicine

## 2019-11-26 ENCOUNTER — Ambulatory Visit (AMBULATORY_SURGERY_CENTER): Payer: BC Managed Care – PPO | Admitting: Internal Medicine

## 2019-11-26 VITALS — BP 102/70 | HR 60 | Temp 97.3°F | Resp 20 | Ht 64.0 in | Wt 172.0 lb

## 2019-11-26 DIAGNOSIS — D124 Benign neoplasm of descending colon: Secondary | ICD-10-CM

## 2019-11-26 DIAGNOSIS — K5140 Inflammatory polyps of colon without complications: Secondary | ICD-10-CM

## 2019-11-26 DIAGNOSIS — D125 Benign neoplasm of sigmoid colon: Secondary | ICD-10-CM

## 2019-11-26 DIAGNOSIS — Z86010 Personal history of colonic polyps: Secondary | ICD-10-CM | POA: Diagnosis not present

## 2019-11-26 MED ORDER — SODIUM CHLORIDE 0.9 % IV SOLN: 500.0000 mL | INTRAVENOUS | Status: DC

## 2019-11-26 NOTE — Progress Notes (Signed)
Called to room to assist during endoscopic procedure.  Patient ID and intended procedure confirmed with present staff. Received instructions for my participation in the procedure from the performing physician.  

## 2019-11-26 NOTE — Patient Instructions (Signed)
YOU HAD AN ENDOSCOPIC PROCEDURE TODAY AT THE [facility]:   Refer to the procedure report that was given to you for any specific questions about what was found during the examination.  If the procedure report does not answer your questions, please call your gastroenterologist to clarify.  If you requested that your care partner not be given the details of your procedure findings, then the procedure report has been included in a sealed envelope for you to review at your convenience later.  YOU SHOULD EXPECT: Some feelings of bloating in the abdomen. Passage of more gas than usual.  Walking can help get rid of the air that was put into your GI tract during the procedure and reduce the bloating. If you had a lower endoscopy (such as a colonoscopy or flexible sigmoidoscopy) you may notice spotting of blood in your stool or on the toilet paper. If you underwent a bowel prep for your procedure, you may not have a normal bowel movement for a few days.  Please Note:  You might notice some irritation and congestion in your nose or some drainage.  This is from the oxygen used during your procedure.  There is no need for concern and it should clear up in a day or so.  SYMPTOMS TO REPORT IMMEDIATELY:   Following lower endoscopy (colonoscopy or flexible sigmoidoscopy):  Excessive amounts of blood in the stool  Significant tenderness or worsening of abdominal pains  Swelling of the abdomen that is new, acute  Fever of 100F or higher  For urgent or emergent issues, a gastroenterologist can be reached at any hour by calling [phone]. Do not use MyChart messaging for urgent concerns.    DIET:  We do recommend a small meal at first, but then you may proceed to your regular diet.  Drink plenty of fluids but you should avoid alcoholic beverages for 24 hours.  ACTIVITY:  You should plan to take it easy for the rest of today and you should NOT DRIVE or use heavy machinery until tomorrow (because  of the sedation medicines used during the test).    FOLLOW UP: Our staff will call the number listed on your records 48-72 hours following your procedure to check on you and address any questions or concerns that you may have regarding the information given to you following your procedure. If we do not reach you, we will leave a message.  We will attempt to reach you two times.  During this call, we will ask if you have developed any symptoms of COVID 19. If you develop any symptoms (ie: fever, flu-like symptoms, shortness of breath, cough etc.) before then, please call [phone].  If you test positive for Covid 19 in the 2 weeks post procedure, please call and report this information to us.    If any biopsies were taken you will be contacted by phone or by letter within the next 1-3 weeks.  Please call us at [phone] if you have not heard about the biopsies in 3 weeks.    SIGNATURES/CONFIDENTIALITY: You and/or your care partner have signed paperwork which will be entered into your electronic medical record.  These signatures attest to the fact that that the information above on your After Visit Summary has been reviewed and is understood.  Full responsibility of the confidentiality of this discharge information lies with you and/or your care-partner. 

## 2019-11-26 NOTE — Progress Notes (Signed)
Report to PACU, RN, vss, BBS= Clear.  

## 2019-11-26 NOTE — Progress Notes (Signed)
cw v/s I have reviewed the patient's medical history in detail and updated the computerized patient record.

## 2019-11-26 NOTE — Op Note (Signed)
[facility] Patient Name: [name] Procedure Date: 11/26/2019 8:01 AM MRN: [id number] Endoscopist: Gatha Mayer , MD Age: 59 Referring MD:  Date of Birth: March 07, 1961 Gender: Female Account #: [id number] Procedure:                Colonoscopy Indications:              Surveillance: Personal history of adenomatous                            polyps on last colonoscopy > 5 years ago Medicines:                Propofol per Anesthesia, Monitored Anesthesia Care Procedure:                Pre-Anesthesia Assessment:                           - Prior to the procedure, a History and Physical                            was performed, and patient medications and                            allergies were reviewed. The patient's tolerance of                            previous anesthesia was also reviewed. The risks                            and benefits of the procedure and the sedation                            options and risks were discussed with the patient.                            All questions were answered, and informed consent                            was obtained. Prior Anticoagulants: The patient has                            taken no previous anticoagulant or antiplatelet                            agents. ASA Grade Assessment: II - A patient with                            mild systemic disease. After reviewing the risks                            and benefits, the patient was deemed in                            satisfactory condition to undergo the procedure.  After obtaining informed consent, the colonoscope                            was passed under direct vision. Throughout the                            procedure, the patient's blood pressure, pulse, and                            oxygen saturations were monitored continuously. The                            Colonoscope was introduced through the anus and                             advanced to the the cecum, identified by                            appendiceal orifice and ileocecal valve. The                            colonoscopy was performed without difficulty. The                            patient tolerated the procedure well. The quality                            of the bowel preparation was excellent. The bowel                            preparation used was Miralax via split dose                            instruction. The ileocecal valve, appendiceal                            orifice, and rectum were photographed. Scope In: 8:06:00 AM Scope Out: 8:19:40 AM Scope Withdrawal Time: 0 hours 9 minutes 56 seconds  Total Procedure Duration: 0 hours 13 minutes 40 seconds  Findings:                 The perianal and digital rectal examinations were                            normal.                           Two sessile polyps were found in the sigmoid colon                            and descending colon. The polyps were diminutive in                            size. These polyps were removed with a cold snare.  Resection and retrieval were complete. Verification                            of patient identification for the specimen was                            done. Estimated blood loss was minimal.                           The exam was otherwise without abnormality on                            direct and retroflexion views. Complications:            No immediate complications. Estimated Blood Loss:     Estimated blood loss was minimal. Impression:               - Two diminutive polyps in the sigmoid colon and in                            the descending colon, removed with a cold snare.                            Resected and retrieved.                           - The examination was otherwise normal on direct                            and retroflexion views.                           - Personal history of colonic polyp villous  adenoma                            resected in her 30's. Recommendation:           - Patient has a contact number available for                            emergencies. The signs and symptoms of potential                            delayed complications were discussed with the                            patient. Return to normal activities tomorrow.                            Written discharge instructions were provided to the                            patient.                           - Resume previous diet.                           -  Continue present medications.                           - Repeat colonoscopy is recommended for                            surveillance. The colonoscopy date will be                            determined after pathology results from today's                            exam become available for review. Gatha Mayer, MD 11/26/2019 8:29:22 AM This report has been signed electronically.

## 2019-11-28 ENCOUNTER — Telehealth: Payer: Self-pay | Admitting: *Deleted

## 2019-11-28 ENCOUNTER — Telehealth: Payer: Self-pay

## 2019-11-28 NOTE — Telephone Encounter (Signed)
  Follow up Call-  Call back number 11/26/2019  Post procedure Call Back phone  # [phone]  Permission to leave phone message Yes  Some recent data might be hidden     Patient questions:  Do you have a fever, pain , or abdominal swelling? No. Pain Score  0 *  Have you tolerated food without any problems? Yes.    Have you been able to return to your normal activities? Yes.    Do you have any questions about your discharge instructions: Diet   No. Medications  No. Follow up visit  No.  Do you have questions or concerns about your Care? No.  Actions: * If pain score is 4 or above: No action needed, pain <4.  1. Have you developed a fever since your procedure? no  2.   Have you had an respiratory symptoms (SOB or cough) since your procedure? no  3.   Have you tested positive for COVID 19 since your procedure no  4.   Have you had any family members/close contacts diagnosed with the COVID 19 since your procedure?  no   If yes to any of these questions please route to Joylene John, RN and Joella Prince, RN

## 2019-11-28 NOTE — Telephone Encounter (Signed)
Attempted f/u phone call. No answer. Left message. °

## 2019-12-04 ENCOUNTER — Encounter: Payer: Self-pay | Admitting: Internal Medicine

## 2019-12-10 ENCOUNTER — Other Ambulatory Visit: Payer: BC Managed Care – PPO

## 2019-12-12 NOTE — Addendum Note (Signed)
Addended by: Kelle Darting A on: 12/12/2019 03:29 PM   Modules accepted: Orders

## 2019-12-15 ENCOUNTER — Other Ambulatory Visit: Payer: Self-pay

## 2019-12-15 ENCOUNTER — Other Ambulatory Visit: Payer: BC Managed Care – PPO

## 2019-12-15 DIAGNOSIS — R5383 Other fatigue: Secondary | ICD-10-CM

## 2019-12-15 DIAGNOSIS — E785 Hyperlipidemia, unspecified: Secondary | ICD-10-CM

## 2019-12-16 LAB — COMPREHENSIVE METABOLIC PANEL
AG Ratio: 1.3 (calc) (ref 1.0–2.5)
ALT: 17 U/L (ref 6–29)
AST: 19 U/L (ref 10–35)
Albumin: 4.1 g/dL (ref 3.6–5.1)
Alkaline phosphatase (APISO): 52 U/L (ref 37–153)
BUN/Creatinine Ratio: 19 (calc) (ref 6–22)
BUN: 25 mg/dL (ref 7–25)
CO2: 27 mmol/L (ref 20–32)
Calcium: 9.5 mg/dL (ref 8.6–10.4)
Chloride: 105 mmol/L (ref 98–110)
Creat: 1.34 mg/dL — ABNORMAL HIGH (ref 0.50–1.05)
Globulin: 3.1 g/dL (calc) (ref 1.9–3.7)
Glucose, Bld: 91 mg/dL (ref 65–99)
Potassium: 4.3 mmol/L (ref 3.5–5.3)
Sodium: 140 mmol/L (ref 135–146)
Total Bilirubin: 1.1 mg/dL (ref 0.2–1.2)
Total Protein: 7.2 g/dL (ref 6.1–8.1)

## 2019-12-16 LAB — LIPID PANEL
Cholesterol: 265 mg/dL — ABNORMAL HIGH (ref <200)
HDL: 82 mg/dL (ref >=50)
LDL Cholesterol (Calc): 167 mg/dL (calc) — ABNORMAL HIGH
Non-HDL Cholesterol (Calc): 183 mg/dL (calc) — ABNORMAL HIGH (ref <130)
Total CHOL/HDL Ratio: 3.2 (calc) (ref <5.0)
Triglycerides: 65 mg/dL (ref <150)

## 2019-12-16 LAB — VITAMIN B12: Vitamin B-12: 620 pg/mL (ref 200–1100)

## 2019-12-17 ENCOUNTER — Other Ambulatory Visit: Payer: Self-pay | Admitting: Family Medicine

## 2019-12-17 DIAGNOSIS — E785 Hyperlipidemia, unspecified: Secondary | ICD-10-CM

## 2020-01-07 ENCOUNTER — Other Ambulatory Visit: Payer: BC Managed Care – PPO

## 2020-01-07 DIAGNOSIS — Z20822 Contact with and (suspected) exposure to covid-19: Secondary | ICD-10-CM

## 2020-01-08 LAB — SARS-COV-2, NAA 2 DAY TAT

## 2020-01-08 LAB — NOVEL CORONAVIRUS, NAA: SARS-CoV-2, NAA: NOT DETECTED

## 2020-01-09 ENCOUNTER — Telehealth: Payer: Self-pay | Admitting: Family Medicine

## 2020-01-09 NOTE — Telephone Encounter (Signed)
Pt aware covid lab test negative, covid not detected 

## 2020-01-23 ENCOUNTER — Other Ambulatory Visit: Payer: Self-pay | Admitting: Family Medicine

## 2020-01-23 DIAGNOSIS — M549 Dorsalgia, unspecified: Secondary | ICD-10-CM

## 2020-01-23 NOTE — Telephone Encounter (Signed)
Last written:  alprazolam 10/22/19  Carisoprodol 10/07/19 Last ov: 10/09/19 Next ov: none Contract: none UDS: none

## 2020-03-17 ENCOUNTER — Other Ambulatory Visit: Payer: Self-pay | Admitting: Family Medicine

## 2020-03-18 NOTE — Telephone Encounter (Signed)
Requesting: alprazolam 0.25mg  Contract: None UDS: None Last Visit: 10/09/2019 Next Visit: None Last Refill: 01/23/2020 #30 and 0RF  Please Advise

## 2020-04-16 ENCOUNTER — Other Ambulatory Visit: Payer: Self-pay | Admitting: Family Medicine

## 2020-04-16 NOTE — Telephone Encounter (Signed)
Requesting: Alprazolam  Contract: none UDS: none Last Visit: 10/09/2019 Next Visit: none Last Refill: 03/18/2020  Please Advise

## 2020-05-11 ENCOUNTER — Other Ambulatory Visit: Payer: Self-pay | Admitting: Family Medicine

## 2020-05-11 DIAGNOSIS — M549 Dorsalgia, unspecified: Secondary | ICD-10-CM

## 2020-05-12 NOTE — Telephone Encounter (Signed)
Requesting: alprazolam and soma Contract: none UDS: none Last Visit: 10/09/19  Next Visit: none Last Refill:  Alprazolam 04/16/20  Soma 01/23/20  Please Advise

## 2020-06-13 ENCOUNTER — Other Ambulatory Visit: Payer: Self-pay | Admitting: Family Medicine

## 2020-06-14 ENCOUNTER — Other Ambulatory Visit: Payer: Self-pay | Admitting: Family Medicine

## 2020-06-15 MED ORDER — ALPRAZOLAM 0.25 MG PO TABS: ORAL_TABLET | ORAL | 0 refills | Status: DC

## 2020-06-15 NOTE — Telephone Encounter (Signed)
Last OV with PCP on 10/09/2019 Last RF--#30 no refills on 05/12/2020 No CSC/UDS No scheduled upcoming appt.

## 2020-06-15 NOTE — Telephone Encounter (Signed)
1 refill sent- pt will need ov , uds and contract

## 2020-06-15 NOTE — Telephone Encounter (Signed)
Called the patient to schedule appt.and inform refill done. She will call back as in need to see PCP. 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

## 2020-06-28 ENCOUNTER — Encounter: Payer: Self-pay | Admitting: Family Medicine

## 2020-06-28 ENCOUNTER — Other Ambulatory Visit: Payer: Self-pay

## 2020-06-28 ENCOUNTER — Ambulatory Visit: Payer: BC Managed Care – PPO | Admitting: Family Medicine

## 2020-06-28 VITALS — BP 122/80 | HR 66 | Temp 98.6°F | Resp 18 | Ht 64.0 in | Wt 174.2 lb

## 2020-06-28 DIAGNOSIS — M5442 Lumbago with sciatica, left side: Secondary | ICD-10-CM

## 2020-06-28 MED ORDER — CYCLOBENZAPRINE HCL 10 MG PO TABS: 10.0000 mg | ORAL_TABLET | Freq: Three times a day (TID) | ORAL | 0 refills | Status: DC | PRN

## 2020-06-28 MED ORDER — PREDNISONE 10 MG PO TABS: ORAL_TABLET | ORAL | 0 refills | Status: DC

## 2020-06-28 NOTE — Progress Notes (Signed)
Patient ID: [name], female    DOB: 1960-07-14  Age: 60 y.o. MRN: [id number]    Subjective:  Subjective  HPI [name] presents for an office visit today. She complains of left lower back pain. She reports the pain started a week ago and had progressively worsen. She states that she had been icing the area, applying heat, and taken 350 mg Soma PO, the symptoms was relieve for a few hours but returned. She also describe having left LE tingle sensation down to the knee, secondary to her back pain. She complains of having weakness while walking secondary to her back pain. She denies any chest pain, SOB, fever, abdominal pain, cough, chills, sore throat, dysuria, urinary incontinence, HA, or N/VD at this time.  Review of Systems  Constitutional: Negative for chills, fatigue and fever.  HENT: Negative for ear pain, sinus pressure, sinus pain and sore throat.   Eyes: Negative for pain.  Respiratory: Negative for cough, shortness of breath and wheezing.   Cardiovascular: Negative for chest pain, palpitations and leg swelling.  Gastrointestinal: Negative for abdominal pain, blood in stool, constipation, diarrhea, nausea and vomiting.  Genitourinary: Negative for dysuria, frequency, hematuria and urgency.  Musculoskeletal: Positive for back pain (Left lower back).  Neurological: Positive for weakness (When standing). Negative for headaches.       (+) tingle sensation on left LE down to the knee.    History Past Medical History:  Diagnosis Date  . Allergy    ALSO DRY EYES  . Anemia    NOS  . 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 Melvyn Novas  . Blood transfusion without reported diagnosis    years ago (2003)  . GERD (gastroesophageal reflux disease)   . History of colonic polyps   . Hyperglycemia   . Hyperlipidemia   . Villous adenoma 1997    She has a past surgical history that includes Abdominal hysterectomy; Fetal blood transfusion (03/2001); Tonsillectomy; Partial  colectomy (1997); Colonoscopy (2010); and Appendectomy.   Her family history includes Asthma in her maternal grandmother; Colon cancer in her maternal aunt; Colon cancer (age of onset: 76) in her father; Diabetes in her paternal uncle; Hyperlipidemia in her brother; Hypertension in her brother and mother; Leukemia in her father.She reports that she quit smoking about 19 years ago. Her smoking use included cigarettes. She has never used smokeless tobacco. She reports that she does not drink alcohol and does not use drugs.  Current Outpatient Medications on File Prior to Visit  Medication Sig Dispense Refill  . ALPRAZolam (XANAX) 0.25 MG tablet 1 po tid prn 30 tablet 0  . cetirizine (ZYRTEC) 10 MG tablet Take 10 mg by mouth daily as needed for allergies.    . famotidine (PEPCID) 20 MG tablet One after breakfast and after supper 60 tablet 11  . prednisoLONE acetate (PRED FORTE) 1 % ophthalmic suspension 1 drop 2 (two) times daily.     No current facility-administered medications on file prior to visit.     Objective:  Objective  Physical Exam Vitals and nursing note reviewed.  Constitutional:      General: She is not in acute distress.    Appearance: Normal appearance. She is well-developed. She is not ill-appearing.  HENT:     Head: Normocephalic and atraumatic.     Right Ear: External ear normal.     Left Ear: External ear normal.     Nose: Nose normal.  Eyes:     Extraocular Movements: Extraocular  movements intact.     Pupils: Pupils are equal, round, and reactive to light.  Cardiovascular:     Rate and Rhythm: Normal rate and regular rhythm.     Pulses: Normal pulses.     Heart sounds: Normal heart sounds. No murmur heard. No friction rub. No gallop.   Pulmonary:     Effort: Pulmonary effort is normal. No respiratory distress.     Breath sounds: Normal breath sounds. No wheezing, rhonchi or rales.  Abdominal:     General: Bowel sounds are normal. There is no distension.      Palpations: Abdomen is soft.     Tenderness: There is no abdominal tenderness. There is no guarding or rebound.     Hernia: No hernia is present.  Musculoskeletal:        General: Normal range of motion.     Cervical back: Normal range of motion and neck supple.     Right upper leg: Normal.     Left upper leg: Normal.     Right knee: Normal.     Left knee: Normal.     Right lower leg: Normal.     Left lower leg: Normal.     Comments: There is 5/5 bilateral LE strength present.   Skin:    General: Skin is warm and dry.  Neurological:     Mental Status: She is alert and oriented to person, place, and time.  Psychiatric:        Behavior: Behavior normal.        Thought Content: Thought content normal.    BP 122/80 (BP Location: Right Arm, Patient Position: Sitting, Cuff Size: Normal)   Pulse 66   Temp 98.6 F (37 C) (Oral)   Resp 18   Ht 5\' 4"  (1.626 m)   Wt 174 lb 3.2 oz (79 kg)   LMP 03/27/2001   SpO2 97%   BMI 29.90 kg/m  Wt Readings from Last 3 Encounters:  06/28/20 174 lb 3.2 oz (79 kg)  11/26/19 172 lb (78 kg)  11/12/19 172 lb 3.2 oz (78.1 kg)     Lab Results  Component Value Date   WBC 5.6 09/18/2019   HGB 14.3 09/18/2019   HCT 41.1 09/18/2019   PLT 254.0 09/18/2019   GLUCOSE 91 12/15/2019   CHOL 265 (H) 12/15/2019   TRIG 65 12/15/2019   HDL 82 12/15/2019   LDLDIRECT 145.6 12/08/2008   LDLCALC 167 (H) 12/15/2019   ALT 17 12/15/2019   AST 19 12/15/2019   NA 140 12/15/2019   K 4.3 12/15/2019   CL 105 12/15/2019   CREATININE 1.34 (H) 12/15/2019   BUN 25 12/15/2019   CO2 27 12/15/2019   TSH 1.50 12/10/2018    DG Chest 2 View  Result Date: 01/03/2019 CLINICAL DATA:  Pneumonia follow-up EXAM: CHEST - 2 VIEW COMPARISON:  12/10/2018 FINDINGS: There is a persistent right-sided infrahilar/perihilar airspace opacity which has not improved since prior study. There is a persistent opacity at the left lung base with blunting of the left costophrenic angle. There  is no pneumothorax or new pulmonary opacity. IMPRESSION: Persistent unchanged opacities in the right perihilar region and at the left lung base. Follow-up with CT is recommended given limited interval change. Electronically Signed   By: Constance Holster M.D.   On: 01/03/2019 19:27     Assessment & Plan:  Plan    Meds ordered this encounter  Medications  . predniSONE (DELTASONE) 10 MG tablet    Sig:  TAKE 3 TABLETS PO QD FOR 3 DAYS THEN TAKE 2 TABLETS PO QD FOR 3 DAYS THEN TAKE 1 TABLET PO QD FOR 3 DAYS THEN TAKE 1/2 TAB PO QD FOR 3 DAYS    Dispense:  20 tablet    Refill:  0  . cyclobenzaprine (FLEXERIL) 10 MG tablet    Sig: Take 1 tablet (10 mg total) by mouth 3 (three) times daily as needed for muscle spasms.    Dispense:  30 tablet    Refill:  0    Problem List Items Addressed This Visit   None   Visit Diagnoses    Acute left-sided low back pain with left-sided sciatica    -  Primary   Relevant Medications   predniSONE (DELTASONE) 10 MG tablet   cyclobenzaprine (FLEXERIL) 10 MG tablet     back exercises given to the pt Muscle relaxers and pred taper Can con't aleve prn  Heat/ ice  rto if no improvement   Follow-up: Return if symptoms worsen or fail to improve.   I,Gordon Zheng,acting as a Education administrator for [employer], DO.,have documented all relevant documentation on the behalf of Ann Held, DO,as directed by  Ann Held, DO while in the presence of [employer], DO, have reviewed all documentation for this visit. The documentation on 06/28/20 for the exam, diagnosis, procedures, and orders are all accurate and complete.  Ann Held, DO

## 2020-06-28 NOTE — Patient Instructions (Signed)

## 2020-07-06 ENCOUNTER — Encounter: Payer: Self-pay | Admitting: Family Medicine

## 2020-07-06 ENCOUNTER — Ambulatory Visit: Payer: BC Managed Care – PPO | Admitting: Family Medicine

## 2020-07-06 ENCOUNTER — Other Ambulatory Visit: Payer: Self-pay

## 2020-07-06 ENCOUNTER — Encounter: Payer: Self-pay | Admitting: Internal Medicine

## 2020-07-06 ENCOUNTER — Other Ambulatory Visit: Payer: Self-pay | Admitting: Family Medicine

## 2020-07-06 ENCOUNTER — Ambulatory Visit: Payer: BC Managed Care – PPO | Admitting: Internal Medicine

## 2020-07-06 VITALS — BP 120/84 | HR 67 | Temp 98.1°F | Resp 16 | Ht 64.0 in | Wt 179.4 lb

## 2020-07-06 DIAGNOSIS — M5416 Radiculopathy, lumbar region: Secondary | ICD-10-CM

## 2020-07-06 DIAGNOSIS — M5442 Lumbago with sciatica, left side: Secondary | ICD-10-CM

## 2020-07-06 NOTE — Telephone Encounter (Signed)
Keep 1 pm app but I did order MRI

## 2020-07-06 NOTE — Progress Notes (Deleted)
   IPeterson Lombard, LAT, ATC acting as a scribe for Lynne Leader, MD.  Subjective:    CC: Left-sided low back pain  HPI: Pt is a 60 y/o female c/o L-sided low back pain ongoing since the end of March w/ worsening pain the first week of April. Pt locates pain to the L side of her low back. Pt was seen by her PCP on 06/28/20 w/ these complaints and was prescribed prednisone, flexeril, was given back exercises and advised to cont heat/ice, and naproxen.   Radiating pn: LE numbness/tingling: yes- down to knee LE weakness: yes- while walking Aggravates: Treatments tried: ice, heat, Soma 350mg , prednisone, flexeril, naproxen  Pertinent review of Systems: ***  Relevant historical information: ***   Objective:   There were no vitals filed for this visit. General: Well Developed, well nourished, and in no acute distress.   MSK: ***  Lab and Radiology Results No results found for this or any previous visit (from the past 72 hour(s)). No results found.    Impression and Recommendations:    Assessment and Plan: 60 y.o. female with ***.  PDMP not reviewed this encounter. No orders of the defined types were placed in this encounter.  No orders of the defined types were placed in this encounter.   Discussed warning signs or symptoms. Please see discharge instructions. Patient expresses understanding.   ***

## 2020-07-06 NOTE — Progress Notes (Signed)
Subjective:    Patient ID: [name], female    DOB: [birth year], 60 y.o.   MRN: [id number]  DOS:  07/06/2020 Type of visit - description: Acute  Patient saw PCP 06/28/2020, she reported L back pain, started approximately 06/21/2020, motor strength normal and symmetric. Rx a muscle relaxant and prednisone (never started)   After that visit, she rested, did some stretching and massaging and she felt much better.  Yesterday she went to work, had to do some walking, symptoms resurfaced. Pain is at the left lower back with some radiation into the buttock.  Sometimes she feel numbness on the lateral aspect of the leg and her leg is somewhat weak.  This morning pain was as severe as 10/10.  She took an Aleve this morning. This afternoon pain is ~ the same.  Review of Systems Denies fever chills No recent falls or injuries No bladder or bowel incontinence No saddle anesthesia No rash No dysuria gross hematuria or difficulty urinating   Past Medical History:  Diagnosis Date  . Allergy    ALSO DRY EYES  . Anemia    NOS  . 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 Melvyn Novas  . Blood transfusion without reported diagnosis    years ago (2003)  . GERD (gastroesophageal reflux disease)   . History of colonic polyps   . Hyperglycemia   . Hyperlipidemia   . Villous adenoma 1997    Past Surgical History:  Procedure Laterality Date  . ABDOMINAL HYSTERECTOMY     &USO for fibroid & servere anemia  . APPENDECTOMY    . COLONOSCOPY  2010  . FETAL BLOOD TRANSFUSION  03/2001   pre tah  . PARTIAL COLECTOMY  1997   for "precancerous "polyps , Dr Benjamine Sprague, [facility]  . TONSILLECTOMY      Allergies as of 07/06/2020      Reactions   Codeine    REACTION: GI UPSET      Medication List       Accurate as of July 06, 2020  1:12 PM. If you have any questions, ask your nurse or doctor.        ALPRAZolam 0.25 MG tablet Commonly known as: XANAX 1 po tid prn   cetirizine 10 MG  tablet Commonly known as: ZYRTEC Take 10 mg by mouth daily as needed for allergies.   cyclobenzaprine 10 MG tablet Commonly known as: FLEXERIL Take 1 tablet (10 mg total) by mouth 3 (three) times daily as needed for muscle spasms.   famotidine 20 MG tablet Commonly known as: PEPCID One after breakfast and after supper   prednisoLONE acetate 1 % ophthalmic suspension Commonly known as: PRED FORTE 1 drop 2 (two) times daily.   predniSONE 10 MG tablet Commonly known as: DELTASONE TAKE 3 TABLETS PO QD FOR 3 DAYS THEN TAKE 2 TABLETS PO QD FOR 3 DAYS THEN TAKE 1 TABLET PO QD FOR 3 DAYS THEN TAKE 1/2 TAB PO QD FOR 3 DAYS          Objective:   Physical Exam BP 120/84 (BP Location: Left Arm, Patient Position: Sitting, Cuff Size: Small)   Pulse 67   Temp 98.1 F (36.7 C) (Oral)   Resp 16   Ht 5\' 4"  (1.626 m)   Wt 179 lb 6 oz (81.4 kg)   LMP 03/27/2001   SpO2 97%   BMI 30.79 kg/m  General:   Well developed, NAD, BMI noted. HEENT:  Normocephalic . Face symmetric, atraumatic  MSK: Slightly TTP at the left lower back?. Neurologic:  alert & oriented X3.  Speech normal, gait appropriate for age and unassisted. Motor and DTRs: Symmetric, able to walk on her heels and toes. Psych--  Cognition and judgment appear intact.  Cooperative with normal attention span and concentration.  Behavior appropriate. No anxious or depressed appearing.      Assessment    60 year old female, PMH negative for back surgeries, cancer, presents with:  Back pain: Symptoms a started ~2-1/2 weeks, some symptoms suggest a radiculopathy, she reported pain to be 10/10, also left leg weakness when she walked. On exam, she is in no distress despite severe pain, vital signs are stable, motor is symmetric, DTR symmetric. Suspect lumbalgia with possible radiculopathy. Plan: Start prednisone as prescribed by PCP, alternate Tylenol and ibuprofen (GI precautions), continue muscle relaxants. Refer to  Ortho. ER if red flags including true/persistent motor deficits, B/B incontinence, saddle anesthesia, patient verbalized understanding. Work note provided.  Time spent: 32 minutes, reviewing the chart, educating the patient about back pain and red flags.  This visit occurred during the SARS-CoV-2 public health emergency.  Safety protocols were in place, including screening questions prior to the visit, additional usage of staff PPE, and extensive cleaning of exam room while observing appropriate contact time as indicated for disinfecting solutions.

## 2020-07-06 NOTE — Patient Instructions (Signed)
Rest  Heating pad  Alternate Tylenol and ibuprofen.  IBUPROFEN (Advil or Motrin) 200 mg 2 tablets every 12  hours as needed for pain.  Always take it with food because may cause gastritis and ulcers.  If you notice nausea, stomach pain, change in the color of stools --->  Stop the medicine and let us know  Start prednisone  Continue taking the muscle relaxant  We are referring you to the orthopedic doctor  Go to the ER if: Fever chills Severe pain Persistent numbness at the legs, decreased strength of the legs

## 2020-07-07 ENCOUNTER — Ambulatory Visit: Payer: Self-pay

## 2020-07-07 ENCOUNTER — Ambulatory Visit: Payer: BC Managed Care – PPO | Admitting: Family Medicine

## 2020-07-07 DIAGNOSIS — M5442 Lumbago with sciatica, left side: Secondary | ICD-10-CM

## 2020-07-07 MED ORDER — HYDROCODONE-ACETAMINOPHEN 5-325 MG PO TABS: 1.0000 | ORAL_TABLET | Freq: Four times a day (QID) | ORAL | 0 refills | Status: DC | PRN

## 2020-07-07 NOTE — Progress Notes (Signed)
Office Visit Note   Patient: [name]           Date of Birth: [birth year]           MRN: [id number] Visit Date: 07/07/2020 Requested by: Colon Branch, MD [address] [place],  [place] [address] PCP: [name], Carol Apa, DO  Subjective: Chief Complaint  Patient presents with  . Lower Back - Pain    Left lower back pain x 2 & 1/2 weeks. The pain has increased over the last few days. Pain radiates down the anterior thigh to just below the knee. Numbness in the back. No relief from ice/heat. She has had pain in the past that would just go away, but not this time. Was given cyclobenzaprine and prednisone taper. Has not started the prednisone yet. Today, she has taken 2 of the cyclobenzaprine and a Tylenol.    HPI: She is here with low back pain.  Symptoms started 2 and half weeks ago, no definite injury.  Pain is gotten progressively more severe to the point that in the past 2 days, she is in tears and can hardly find a comfortable position.  The pain radiates down the anterior left thigh to just below the knee and it is causing some numbness in her leg.  No bowel or bladder dysfunction, no fevers or chills.  She has tried Flexeril with minimal improvement.  No previous problems with her back.  She has not had a rash.                ROS: She is otherwise in good health.  All other systems were reviewed and are negative.  Objective: Vital Signs: LMP 03/27/2001   Physical Exam:  General:  Alert and oriented, in no acute distress. Pulm:  Breathing unlabored. Psy:  Normal mood, congruent affect. Skin: No rash Low back: No significant tenderness over the lumbar spinous processes.  Moderate tenderness in the left sciatic notch.  No pain over the SI joint or in the flank area.  No pain with internal hip rotation.  Straight leg raise is equivocal.  Lower extremity strength and reflexes are still normal.  Imaging: No results found.  Assessment & Plan: 1.  Severe  left-sided low back pain, suspect upper lumbar disc protrusion. -We will order an MRI scan.  Consider physical therapy.  She has a prednisone prescription and will start taking it.  Toradol injection given today.     Procedures: No procedures performed        PMFS History: Patient Active Problem List   Diagnosis Date Noted  . Essential hypertension 10/09/2019  . Other fatigue 10/09/2019  . Elevated BP without diagnosis of hypertension 09/18/2019  . Pneumonia of both lower lobes due to infectious organism 12/11/2018  . Achilles tendon pain 01/29/2018  . Left Achilles tendinitis 12/08/2015  . Skin lesions 05/25/2015  . Costochondritis 04/02/2015  . Muscle spasm of back 07/31/2014  . Chronically dry eyes 11/06/2013  . Allergic dermatitis 11/06/2013  . Contact dermatitis 09/16/2013  . Neck pain on right side 09/02/2013  . Nonallopathic lesion of cervical region 09/02/2013  . Nonallopathic lesion of thoracic region 09/02/2013  . Nonallopathic lesion of lumbosacral region 09/02/2013  . Rotator cuff injury 02/07/2013  . Plantar fasciitis 07/31/2012  . Overweight (BMI 25.0-29.9) 07/31/2012  . Allergic conjunctivitis 12/25/2011  . Dysphagia 12/25/2011  . Acute hip pain 07/31/2011  . Low back pain radiating to right leg 02/06/2011  .  TMJ PAIN 03/29/2010  . HAIR LOSS 03/29/2010  . ADENOPATHY LYMPH GLAND/TRACHEOBR 03/04/2009  . Nonspecific (abnormal) findings on radiological and other examination of body structure 01/22/2009  . Abnormal CT of the chest 01/22/2009  . ARTHRALGIA 01/21/2009  . Atypical chest pain 01/21/2009  . ANHEDONIA 11/17/2008  . FATIGUE 11/17/2008  . BENIGN NEOPLASM OF SKIN SITE UNSPECIFIED 03/19/2008  . CONTACT DERMATITIS DUE TO METALS 02/25/2008  . HYPERLIPIDEMIA 12/13/2007  . ANEMIA-NOS 12/13/2007  . COLONIC POLYPS, HX OF 12/13/2007  . DISTURBANCE OF SKIN SENSATION 05/28/2007   Past Medical History:  Diagnosis Date  . Allergy    ALSO DRY EYES  .  Anemia    NOS  . 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 Melvyn Novas  . Blood transfusion without reported diagnosis    years ago (2003)  . GERD (gastroesophageal reflux disease)   . History of colonic polyps   . Hyperglycemia   . Hyperlipidemia   . Villous adenoma 1997    Family History  Problem Relation Age of Onset  . Colon cancer Father 69  . Leukemia Father   . Hyperlipidemia Brother   . Hypertension Brother   . Colon cancer Maternal Aunt   . Diabetes Paternal Uncle   . Asthma Maternal Grandmother   . Hypertension Mother   . Colon polyps Neg Hx   . Esophageal cancer Neg Hx   . Rectal cancer Neg Hx   . Stomach cancer Neg Hx     Past Surgical History:  Procedure Laterality Date  . ABDOMINAL HYSTERECTOMY     &USO for fibroid & servere anemia  . APPENDECTOMY    . COLONOSCOPY  2010  . FETAL BLOOD TRANSFUSION  03/2001   pre tah  . PARTIAL COLECTOMY  1997   for "precancerous "polyps , Dr Benjamine Sprague, [facility]  . TONSILLECTOMY     Social History   Occupational History  . Occupation: Product manager: [employer]  Tobacco Use  . Smoking status: Former Smoker    Types: Cigarettes    Quit date: 03/27/2001    Years since quitting: 19.2  . Smokeless tobacco: Never Used  . Tobacco comment: pt states only smoked socially and unsure how many years she smoked  Vaping Use  . Vaping Use: Never used  Substance and Sexual Activity  . Alcohol use: No  . Drug use: Never  . Sexual activity: Not on file

## 2020-07-07 NOTE — Progress Notes (Signed)
Ketorolac tromethamine (generic Toradol) 60 mg (2 cc of 30 mg/mL) given IM left dorsogluteal area.

## 2020-07-08 ENCOUNTER — Encounter: Payer: Self-pay | Admitting: Family Medicine

## 2020-07-12 ENCOUNTER — Other Ambulatory Visit: Payer: Self-pay | Admitting: Family Medicine

## 2020-07-12 DIAGNOSIS — M5442 Lumbago with sciatica, left side: Secondary | ICD-10-CM

## 2020-07-12 NOTE — Telephone Encounter (Signed)
It can be from sciatica but if you have swelling, redness and pain with palpation  It could be a different problem and you should be seen somewhere today if not here the er

## 2020-07-13 ENCOUNTER — Encounter: Payer: Self-pay | Admitting: Family Medicine

## 2020-07-13 ENCOUNTER — Telehealth: Payer: Self-pay | Admitting: Family Medicine

## 2020-07-13 DIAGNOSIS — M5442 Lumbago with sciatica, left side: Secondary | ICD-10-CM

## 2020-07-13 MED ORDER — HYDROCODONE-ACETAMINOPHEN 5-325 MG PO TABS: 1.0000 | ORAL_TABLET | Freq: Four times a day (QID) | ORAL | 0 refills | Status: DC | PRN

## 2020-07-13 NOTE — Telephone Encounter (Signed)
I'm hoping the prednisone helps you and will be interested in what the MRI shows

## 2020-07-13 NOTE — Telephone Encounter (Signed)
PT referral requested to [facility] location.  Hydrocodone refilled.  I will send a link to a You Tube video with home exercises.

## 2020-07-13 NOTE — Telephone Encounter (Signed)
Pt called stating the pain in her upper L thigh isn't subsiding at all.Pt  isn't scheduled for an MRI until 07/17/20 and she would like to know if there's any way to move that up or have the referral sent elsewhere? Pt did mention having a CT done in [place] before. If not she would like to know can she be advised of some thing to try at home? Pt would like a CB to discuss further.  [phone]

## 2020-07-13 NOTE — Telephone Encounter (Signed)
Per Gabriel Cirri, this is the soonest appointment. Please advise on anything she can do until then.

## 2020-07-13 NOTE — Telephone Encounter (Signed)
I called and advised the patient of the plan and the refill. She has received the link and said she will start the exercises.   The patient is asking if she could have the MRI in [place] because she has heard that it is cheaper there. I told her I would need to send this to our referral coordinator to see if this could be done.

## 2020-07-14 ENCOUNTER — Telehealth: Payer: Self-pay

## 2020-07-14 NOTE — Telephone Encounter (Signed)
Called pt and advised her that order was changed to [facility] and someone from that location will be calling to schedule her

## 2020-07-14 NOTE — Telephone Encounter (Signed)
I called the patient to let her know I received a prior authorization request on the newest Rx for Hydrocodone/acet that was sent in by Dr. Junius Roads. When I tried to get this authorized through CoverMyMeds, I got the message of no eligibility for this patient. She said her insurance was the same and there were no issues with the first Rx for this. She has not picked up this new Rx because she has not even finished the first bottle. It is possible that the issue is that it was too early to get this filled and the PA was for an override. I also tried calling her pharmacy [facility] on [address] in [place]) - was on hold for 10 minutes and so I called back and left a voice mail explaining the issue with the PA. Asked that someone call back about this, if they know why I am hitting a roadblock. I told the patient I will hold on to this PA form and if there is a problem when she tries to get this refill of the hydrocodone/acet, I will work on it again.

## 2020-07-14 NOTE — Telephone Encounter (Signed)
Ortho ordered it We were not able to get it to go through -- that is why Dr Larose Kells sent her to ortho  So they were able to get it scheduled --- she would have to call them

## 2020-07-17 ENCOUNTER — Other Ambulatory Visit: Payer: Self-pay

## 2020-07-17 ENCOUNTER — Ambulatory Visit (HOSPITAL_BASED_OUTPATIENT_CLINIC_OR_DEPARTMENT_OTHER): Payer: BC Managed Care – PPO

## 2020-07-17 ENCOUNTER — Ambulatory Visit (INDEPENDENT_AMBULATORY_CARE_PROVIDER_SITE_OTHER): Payer: BC Managed Care – PPO

## 2020-07-17 DIAGNOSIS — M79605 Pain in left leg: Secondary | ICD-10-CM | POA: Diagnosis not present

## 2020-07-17 DIAGNOSIS — M5442 Lumbago with sciatica, left side: Secondary | ICD-10-CM

## 2020-07-18 ENCOUNTER — Telehealth: Payer: Self-pay | Admitting: Family Medicine

## 2020-07-18 DIAGNOSIS — M5126 Other intervertebral disc displacement, lumbar region: Secondary | ICD-10-CM

## 2020-07-18 NOTE — Telephone Encounter (Signed)
MRI confirms a left-sided disc protrusion at L2-3, probably causing impingement on the L2 nerve root.  This is the most likely cause of your pain.   Treatment options include:  - Trial of physical therapy.  - Referral for epidural steroid injection.  - Referral for surgical consult.

## 2020-07-19 ENCOUNTER — Telehealth: Payer: Self-pay

## 2020-07-19 NOTE — Telephone Encounter (Signed)
Pt sent the message below in a mychart messge while you were out- I let her know that I would let you know once you returned.  Good afternoon,  The MRI results are in and I would like for Dr. Etter Sjogren to give me her advice on what procedure I should have, moving forward. According to Dr. Junius Roads, my options are: Physical Therapy Epidural Surgery  The pain is still present and my left front thigh is not doing well.

## 2020-07-19 NOTE — Addendum Note (Signed)
Addended by: Hortencia Pilar on: 07/19/2020 03:50 PM   Modules accepted: Orders

## 2020-07-21 ENCOUNTER — Telehealth: Payer: Self-pay | Admitting: Family Medicine

## 2020-07-21 NOTE — Telephone Encounter (Signed)
Pt called asking to have the work note Dr. Junius Roads wrote up for her faxed and if this is possible she would like a CB to be notified after it's done.   Fax# [phone]

## 2020-07-21 NOTE — Telephone Encounter (Signed)
Note faxed. Patient advised.

## 2020-07-26 NOTE — Telephone Encounter (Signed)
It would probably try epidural and pt first and then do surgery if no better unless her pain is worsening

## 2020-07-26 NOTE — Telephone Encounter (Signed)
Message sent via MyChart.

## 2020-07-29 NOTE — Telephone Encounter (Signed)
We can't do the MRI's. She will have to get it from where she had it done. Or is it a CD she brought in that should be in my box?

## 2020-07-30 ENCOUNTER — Ambulatory Visit: Payer: BC Managed Care – PPO | Admitting: Physical Therapy

## 2020-08-18 ENCOUNTER — Other Ambulatory Visit: Payer: Self-pay | Admitting: Family Medicine

## 2020-08-19 NOTE — Telephone Encounter (Signed)
Requesting: alprazolam 0.25mg  Contract: None UDS: None Last Visit: 07/06/2020 Next Visit: None Last Refill: 06/15/2020 #30 and 0RF  Please Advise

## 2020-09-14 ENCOUNTER — Other Ambulatory Visit: Payer: Self-pay | Admitting: Family Medicine

## 2020-09-14 DIAGNOSIS — M5442 Lumbago with sciatica, left side: Secondary | ICD-10-CM

## 2020-09-24 ENCOUNTER — Other Ambulatory Visit: Payer: Self-pay | Admitting: Family Medicine

## 2020-09-28 ENCOUNTER — Other Ambulatory Visit: Payer: Self-pay | Admitting: Family Medicine

## 2020-09-28 MED ORDER — ALPRAZOLAM 0.25 MG PO TABS: ORAL_TABLET | ORAL | 0 refills | Status: DC

## 2020-09-28 NOTE — Telephone Encounter (Signed)
Requesting: alprazolam Contract: none UDS: none Last Visit: 06/28/20 Next Visit: none Last Refill: 08/19/20  Please Advise

## 2020-10-25 ENCOUNTER — Other Ambulatory Visit: Payer: Self-pay

## 2020-10-25 ENCOUNTER — Encounter: Payer: Self-pay | Admitting: Family Medicine

## 2020-10-25 ENCOUNTER — Ambulatory Visit: Payer: BC Managed Care – PPO | Admitting: Family Medicine

## 2020-10-25 VITALS — BP 122/80 | HR 69 | Temp 99.1°F | Resp 18 | Ht 64.0 in | Wt 177.2 lb

## 2020-10-25 DIAGNOSIS — M3501 Sicca syndrome with keratoconjunctivitis: Secondary | ICD-10-CM | POA: Insufficient documentation

## 2020-10-25 DIAGNOSIS — Z111 Encounter for screening for respiratory tuberculosis: Secondary | ICD-10-CM

## 2020-10-25 NOTE — Assessment & Plan Note (Signed)
Form filled out today. 

## 2020-10-25 NOTE — Assessment & Plan Note (Signed)
F/u opth

## 2020-10-25 NOTE — Patient Instructions (Signed)
Return Thursday afternoon to have Tb test

## 2020-10-25 NOTE — Progress Notes (Signed)
Subjective:   By signing my name below, I, Zite Okoli, attest that this documentation has been prepared under the direction and in the presence of Roma Schanz R DO. 10/25/2020   Patient ID: [name], female    DOB: 1961-01-05, 60 y.o.   MRN: [id number]  Chief Complaint  Patient presents with   Exam for job    Pt says her eyes are aching and looking around hurts. She says her mouth and eyes are dry and wonders if she is having a flare up of sjogren's    HPI Patient is in today for an office visit and to have a form filled for her new job.  She is here for an eye exam today and it showed she has 20/20 vision. She reports she has been having dry eyes and her eyes are glassy when she looks to the side. She notes this is more prominent in the left eye.She also mentions having a dry mouth. She mentions she has trouble lifting heavy things because of a previous back problem. She had a tuberculosis test done today. She has 3 Covid-19 vaccines at this time and is unsure of getting the 2nd booster vaccine.   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 Melvyn Novas   Blood transfusion without reported diagnosis    years ago (2003)   GERD (gastroesophageal reflux disease)    History of colonic polyps    Hyperglycemia    Hyperlipidemia    Villous adenoma 1997    Past Surgical History:  Procedure Laterality Date   ABDOMINAL HYSTERECTOMY     &USO for fibroid & servere anemia   APPENDECTOMY     COLONOSCOPY  2010   FETAL BLOOD TRANSFUSION  03/2001   pre tah   PARTIAL COLECTOMY  1997   for "precancerous "polyps , Dr Benjamine Sprague, [place]   TONSILLECTOMY      Family History  Problem Relation Age of Onset   Colon cancer Father 48   Leukemia Father    Hyperlipidemia Brother    Hypertension Brother    Colon cancer Maternal Aunt    Diabetes Paternal Uncle    Asthma Maternal  Grandmother    Hypertension Mother    Colon polyps Neg Hx    Esophageal cancer Neg Hx    Rectal cancer Neg Hx    Stomach cancer Neg Hx     Social History   Socioeconomic History   Marital status: Single    Spouse name: Not on file   Number of children: Not on file   Years of education: Not on file   Highest education level: Not on file  Occupational History   Occupation: Product manager: [employer]  Tobacco Use   Smoking status: Former    Types: Cigarettes    Quit date: 03/27/2001    Years since quitting: 19.5   Smokeless tobacco: Never   Tobacco comments:    pt states only smoked socially and unsure how many years she smoked  Vaping Use   Vaping Use: Never used  Substance and Sexual Activity   Alcohol use: No   Drug use: Never   Sexual activity: Not on file  Other Topics Concern   Not on file  Social History Narrative   Gets regular exercise  Social Determinants of Health   Financial Resource Strain: Not on file  Food Insecurity: Not on file  Transportation Needs: Not on file  Physical Activity: Not on file  Stress: Not on file  Social Connections: Not on file  Intimate Partner Violence: Not on file    Outpatient Medications Prior to Visit  Medication Sig Dispense Refill   ALPRAZolam (XANAX) 0.25 MG tablet Take 1 tablet by mouth three times daily as needed 30 tablet 0   cetirizine (ZYRTEC) 10 MG tablet Take 10 mg by mouth daily as needed for allergies.     cyclobenzaprine (FLEXERIL) 10 MG tablet Take 1 tablet by mouth three times daily as needed for muscle spasm 30 tablet 0   famotidine (PEPCID) 20 MG tablet One after breakfast and after supper (Patient not taking: Reported on 10/25/2020) 60 tablet 11   HYDROcodone-acetaminophen (NORCO/VICODIN) 5-325 MG tablet Take 1 tablet by mouth every 6 (six) hours as needed for moderate pain. (Patient not taking: Reported on 10/25/2020) 20 tablet 0   prednisoLONE acetate (PRED FORTE) 1 % ophthalmic  suspension 1 drop 2 (two) times daily. (Patient not taking: Reported on 10/25/2020)     predniSONE (DELTASONE) 10 MG tablet TAKE 3 TABLETS PO QD FOR 3 DAYS THEN TAKE 2 TABLETS PO QD FOR 3 DAYS THEN TAKE 1 TABLET PO QD FOR 3 DAYS THEN TAKE 1/2 TAB PO QD FOR 3 DAYS (Patient not taking: Reported on 10/25/2020) 20 tablet 0   No facility-administered medications prior to visit.    Allergies  Allergen Reactions   Codeine     REACTION: GI UPSET    Review of Systems  Constitutional:  Negative for fever and malaise/fatigue.  HENT:  Negative for congestion.   Eyes:  Negative for blurred vision.  Respiratory:  Negative for cough and shortness of breath.   Cardiovascular:  Negative for chest pain, palpitations and leg swelling.  Gastrointestinal:  Negative for abdominal pain, blood in stool, nausea and vomiting.  Genitourinary:  Negative for dysuria and frequency.  Musculoskeletal:  Negative for back pain and falls.  Skin:  Negative for rash.  Neurological:  Negative for dizziness, loss of consciousness and headaches.  Endo/Heme/Allergies:  Negative for environmental allergies.  Psychiatric/Behavioral:  Negative for depression. The patient is not nervous/anxious.       Objective:    Physical Exam Vitals and nursing note reviewed.  Constitutional:      General: She is not in acute distress.    Appearance: Normal appearance. She is well-developed. She is not ill-appearing.  HENT:     Head: Normocephalic and atraumatic.     Right Ear: External ear normal.     Left Ear: External ear normal.  Eyes:     Extraocular Movements: Extraocular movements intact.     Conjunctiva/sclera: Conjunctivae normal.     Pupils: Pupils are equal, round, and reactive to light.  Neck:     Thyroid: No thyromegaly.     Vascular: No carotid bruit or JVD.  Cardiovascular:     Rate and Rhythm: Normal rate and regular rhythm.     Pulses: Normal pulses.     Heart sounds: Normal heart sounds. No murmur heard.   No  gallop.  Pulmonary:     Effort: Pulmonary effort is normal. No respiratory distress.     Breath sounds: Normal breath sounds. No wheezing, rhonchi or rales.  Chest:     Chest wall: No tenderness.  Abdominal:     General: Bowel sounds are normal.  There is no distension.     Palpations: Abdomen is soft. There is no mass.     Tenderness: There is no abdominal tenderness. There is no guarding or rebound.     Hernia: No hernia is present.  Musculoskeletal:     Cervical back: Normal range of motion and neck supple.  Lymphadenopathy:     Cervical: No cervical adenopathy.  Skin:    General: Skin is warm and dry.  Neurological:     Mental Status: She is alert and oriented to person, place, and time.  Psychiatric:        Behavior: Behavior normal.    BP 122/80 (BP Location: Left Arm, Patient Position: Sitting, Cuff Size: Normal)   Pulse 69   Temp 99.1 F (37.3 C) (Oral)   Resp 18   Ht '5\' 4"'$  (1.626 m)   Wt 177 lb 3.2 oz (80.4 kg)   LMP 03/27/2001   SpO2 99%   BMI 30.42 kg/m  Wt Readings from Last 3 Encounters:  10/25/20 177 lb 3.2 oz (80.4 kg)  07/06/20 179 lb 6 oz (81.4 kg)  06/28/20 174 lb 3.2 oz (79 kg)    Diabetic Foot Exam - Simple   No data filed    Lab Results  Component Value Date   WBC 5.6 09/18/2019   HGB 14.3 09/18/2019   HCT 41.1 09/18/2019   PLT 254.0 09/18/2019   GLUCOSE 91 12/15/2019   CHOL 265 (H) 12/15/2019   TRIG 65 12/15/2019   HDL 82 12/15/2019   LDLDIRECT 145.6 12/08/2008   LDLCALC 167 (H) 12/15/2019   ALT 17 12/15/2019   AST 19 12/15/2019   NA 140 12/15/2019   K 4.3 12/15/2019   CL 105 12/15/2019   CREATININE 1.34 (H) 12/15/2019   BUN 25 12/15/2019   CO2 27 12/15/2019   TSH 1.50 12/10/2018    Lab Results  Component Value Date   TSH 1.50 12/10/2018   Lab Results  Component Value Date   WBC 5.6 09/18/2019   HGB 14.3 09/18/2019   HCT 41.1 09/18/2019   MCV 84.4 09/18/2019   PLT 254.0 09/18/2019   Lab Results  Component Value Date    NA 140 12/15/2019   K 4.3 12/15/2019   CO2 27 12/15/2019   GLUCOSE 91 12/15/2019   BUN 25 12/15/2019   CREATININE 1.34 (H) 12/15/2019   BILITOT 1.1 12/15/2019   ALKPHOS 59 09/18/2019   AST 19 12/15/2019   ALT 17 12/15/2019   PROT 7.2 12/15/2019   ALBUMIN 4.4 09/18/2019   CALCIUM 9.5 12/15/2019   GFR 61.45 09/18/2019   Lab Results  Component Value Date   CHOL 265 (H) 12/15/2019   Lab Results  Component Value Date   HDL 82 12/15/2019   Lab Results  Component Value Date   LDLCALC 167 (H) 12/15/2019   Lab Results  Component Value Date   TRIG 65 12/15/2019   Lab Results  Component Value Date   CHOLHDL 3.2 12/15/2019   No results found for: HGBA1C     Assessment & Plan:   Problem List Items Addressed This Visit       Unprioritized   Screening for tuberculosis    Form filled out today        Relevant Orders   TB Skin Test (Completed)   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   I,Zite Okoli,acting as a Education administrator for [employer], DO.,have documented all relevant documentation on the behalf of Ann Held, DO,as directed by  Ann Held, DO while in the presence of Ann Held, DO.   I, Ann Held DO. , personally preformed the services described in this documentation.  All medical record entries made by the scribe were at my direction and in my presence.  I have reviewed the chart and discharge instructions (if applicable) and agree that the record reflects my personal performance and is accurate and complete. 10/25/2020

## 2020-10-28 LAB — TB SKIN TEST
Induration: 0 mm
TB Skin Test: NEGATIVE

## 2020-11-02 ENCOUNTER — Encounter: Payer: Self-pay | Admitting: Family Medicine

## 2020-11-04 ENCOUNTER — Encounter (INDEPENDENT_AMBULATORY_CARE_PROVIDER_SITE_OTHER): Payer: Self-pay | Admitting: Ophthalmology

## 2020-11-04 ENCOUNTER — Other Ambulatory Visit: Payer: Self-pay

## 2020-11-04 ENCOUNTER — Ambulatory Visit (INDEPENDENT_AMBULATORY_CARE_PROVIDER_SITE_OTHER): Payer: BC Managed Care – PPO | Admitting: Ophthalmology

## 2020-11-04 ENCOUNTER — Encounter (INDEPENDENT_AMBULATORY_CARE_PROVIDER_SITE_OTHER): Payer: BC Managed Care – PPO | Admitting: Ophthalmology

## 2020-11-04 DIAGNOSIS — Z111 Encounter for screening for respiratory tuberculosis: Secondary | ICD-10-CM | POA: Diagnosis not present

## 2020-11-04 DIAGNOSIS — D862 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: Secondary | ICD-10-CM | POA: Diagnosis not present

## 2020-11-04 DIAGNOSIS — H43821 Vitreomacular adhesion, right eye: Secondary | ICD-10-CM

## 2020-11-04 DIAGNOSIS — H3023 Posterior cyclitis, bilateral: Secondary | ICD-10-CM | POA: Diagnosis not present

## 2020-11-04 DIAGNOSIS — H35063 Retinal vasculitis, bilateral: Secondary | ICD-10-CM | POA: Diagnosis not present

## 2020-11-04 DIAGNOSIS — H35352 Cystoid macular degeneration, left eye: Secondary | ICD-10-CM | POA: Diagnosis not present

## 2020-11-04 NOTE — Assessment & Plan Note (Signed)
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

## 2020-11-04 NOTE — Assessment & Plan Note (Signed)
Bilateral snowball opacifications of the inferior vitreous cavity and along the pars plana.  Associated with retinal PERI phlebitis

## 2020-11-04 NOTE — Assessment & Plan Note (Addendum)
Patient recalls  having systemic sarcoidosis but has never required treatment in the past

## 2020-11-04 NOTE — Assessment & Plan Note (Signed)
REcent skin test, last week for new employment, and the test was read as negative

## 2020-11-04 NOTE — Progress Notes (Signed)
11/04/2020     CHIEF COMPLAINT Patient presents for Retina Evaluation   HISTORY OF PRESENT ILLNESS: [name] is a 60 y.o. female who presents to the clinic today for:   HPI     Retina Evaluation           Laterality: both eyes   Onset: 1 month ago   Duration: 1 month   Associated Symptoms: Floaters and Photophobia (Flourescent lights are irritating, when using sunglass it helps. States she has chronic dry eye.)   Treatments tried: artificial tears (Uses blink/ theratears for dry eye. No prescription eye drops.)         Comments   NP Bilateral Floaters, Snowball referred by Dr. Zenia Resides. Patient states she saw Dr. Katy Fitch due to floaters, left eye blurred. Pt states she saw Dr. Katy Fitch Monday or Tuesday this week. States she has always had floaters but now they are more persistent and a few more floaters she has noticed. Onset within the last couple weeks, within the month.       Last edited by Laurin Coder, COA on 11/04/2020  8:29 AM.      Referring physician: [name], Alferd Apa, DO [address] [place],  [place] [address]  HISTORICAL INFORMATION:   Selected notes from the [facility]: No current outpatient medications on file. (Ophthalmic Drugs)   No current facility-administered medications for this visit. (Ophthalmic Drugs)   Current Outpatient Medications (Other)  Medication Sig   ALPRAZolam (XANAX) 0.25 MG tablet Take 1 tablet by mouth three times daily as needed   cetirizine (ZYRTEC) 10 MG tablet Take 10 mg by mouth daily as needed for allergies.   cyclobenzaprine (FLEXERIL) 10 MG tablet Take 1 tablet by mouth three times daily as needed for muscle spasm   No current facility-administered medications for this visit. 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 Melvyn Novas   Blood transfusion without reported diagnosis    years ago (2003)   GERD (gastroesophageal reflux disease)    History of colonic polyps    Hyperglycemia    Hyperlipidemia    Villous adenoma 1997   Past Surgical History:  Procedure Laterality Date   ABDOMINAL HYSTERECTOMY     &USO for fibroid & servere anemia   APPENDECTOMY     COLONOSCOPY  2010   FETAL BLOOD TRANSFUSION  03/2001   pre tah   PARTIAL COLECTOMY  1997   for "precancerous "polyps , Dr Benjamine Sprague, [place]   TONSILLECTOMY      FAMILY HISTORY Family History  Problem Relation Age of Onset   Colon cancer Father 41   Leukemia Father    Hyperlipidemia Brother    Hypertension Brother    Colon cancer Maternal Aunt    Diabetes Paternal Uncle    Asthma Maternal Grandmother    Hypertension Mother    Colon polyps Neg Hx    Esophageal cancer Neg Hx    Rectal cancer Neg Hx    Stomach cancer Neg Hx     SOCIAL HISTORY Social History   Tobacco Use  Smoking status: Former    Types: Cigarettes    Quit date: 03/27/2001    Years since quitting: 19.6   Smokeless tobacco: Never   Tobacco comments:    pt states only smoked socially and unsure how many years she smoked  Vaping Use   Vaping Use: Never used  Substance Use Topics   Alcohol use: No   Drug use: Never         OPHTHALMIC EXAM:  Base Eye Exam     Visual Acuity (ETDRS)       Right Left   Dist [place] 20/25 -1 20/60   Dist ph [place]  NI         Tonometry (Tonopen, 8:35 AM)       Right Left   Pressure 13 18         Pupils       Pupils Dark Light Shape React APD   Right PERRL 6 5 Round Brisk None   Left PERRL 6 5 Round Brisk None         Visual Fields (Counting fingers)       Left Right    Full Full         Extraocular Movement       Right Left    Full Full         Neuro/Psych     Oriented x3: Yes   Mood/Affect:  Normal         Dilation     Both eyes: 1.0% Mydriacyl, 2.5% Phenylephrine @ 8:35 AM           Slit Lamp and Fundus Exam     External Exam       Right Left   External Normal Normal         Slit Lamp Exam       Right Left   Lids/Lashes Normal Normal   Conjunctiva/Sclera White and quiet White and quiet   Cornea Clear Clear   Anterior Chamber Deep and quiet Deep and quiet   Iris Round and reactive Round and reactive   Lens Clear Clear   Vitreous Normal Normal         Fundus Exam       Right Left   Disc Normal Normal   Macula Normal Normal   Vessels Normal Normal   Periphery Normal Normal            IMAGING AND PROCEDURES  Imaging and Procedures for 11/04/20  OCT, Retina - OU - Both Eyes       Right Eye Quality was good. Scan locations included subfoveal. Central Foveal Thickness: 318. Findings include vitreomacular adhesion .   Left Eye Quality was good. Scan locations included subfoveal. Central Foveal Thickness: 395. Findings include cystoid macular edema.   Notes Nonpathologic vitreomacular adhesion OD,  OS with minor CME, looks some aspects of chronic, with incidental posterior vitreous detachment OS n     Color Fundus Photography Optos - OU - Both Eyes       Right Eye Progression has no prior data. Disc findings include normal observations.   Left Eye Progression has no prior data. Disc findings include normal observations.   Notes Small ball opacifications in the inferior vitreous OU, with fine detail retinal periphlebitis of the retinal veins inferiorly.             ASSESSMENT/PLAN:  Screening for tuberculosis REcent skin test, last week for new employment, and the test was read as negative  Pars planitis of both eyes Bilateral snowball opacifications of the inferior vitreous cavity and along the pars plana.  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 Pars planitis of both eyes  H30.23 OCT, Retina - OU - Both Eyes    Color Fundus Photography Optos - OU - Both Eyes    2. Screening for tuberculosis  Z11.1     3. Retinal vasculitis, bilateral  H35.063     4. 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 Cystoid macular edema of left eye  H35.352 OCT, Retina - OU - Both Eyes    Color Fundus Photography Optos - OU - Both Eyes    6. Vitreomacular adhesion of right eye  H43.821       1.  New onset snowball opacifications coincident with minor CME only OS at this time.  Nonetheless patient has a history of known sarcoidosis, serologically yet no therapy required in the past either systemically or locally.  2.  We will attempt to make phone call for referral today for evaluation within 2 to 3-week time.  3.  Explained to the patient that the minor CME in the left eye can be tolerated at this time and no treatment be undertaken so that Dr. Manuella Ghazi can see the native,  untreated findings currently present.  Patient instructed to contact this office within 2 weeks if no appointment is scheduled within 3 weeks.  Ophthalmic Meds Ordered this visit:  No orders of the defined types were placed in this encounter.      Return for Refer to Dr. Hilarie Fredrickson, [facility].  There are no Patient Instructions on file for this visit.   Explained the diagnoses, plan, and follow up with the patient and they expressed understanding.  Patient expressed understanding of the importance of proper follow up care.   Clent Demark Gianny Killman M.D. Diseases & Surgery of the Retina and Vitreous [facility] [place] 11/04/20     Abbreviations: M myopia  (nearsighted); A astigmatism; H hyperopia (farsighted); P presbyopia; Mrx spectacle prescription;  CTL contact lenses; OD right eye; OS left eye; OU both eyes  XT exotropia; ET esotropia; PEK punctate epithelial keratitis; PEE punctate epithelial erosions; DES dry eye syndrome; MGD meibomian gland dysfunction; ATs artificial tears; PFAT's preservative free artificial tears; [place] nuclear sclerotic cataract; PSC posterior subcapsular cataract; ERM epi-retinal membrane; PVD posterior vitreous detachment; RD retinal detachment; DM diabetes mellitus; DR diabetic retinopathy; NPDR non-proliferative diabetic retinopathy; PDR proliferative diabetic retinopathy; CSME clinically significant macular edema; DME diabetic macular edema; dbh dot blot hemorrhages; CWS cotton wool spot; POAG primary open angle glaucoma; C/D cup-to-disc ratio; HVF humphrey visual field; GVF goldmann visual field; OCT optical coherence tomography; IOP intraocular pressure; BRVO Branch retinal vein occlusion; CRVO central retinal vein occlusion; CRAO central retinal artery occlusion; BRAO branch retinal artery occlusion; RT retinal tear; SB scleral buckle; PPV pars plana vitrectomy; VH Vitreous hemorrhage; PRP panretinal laser photocoagulation; IVK intravitreal kenalog; VMT vitreomacular traction; MH Macular hole;  NVD neovascularization of the disc; NVE neovascularization elsewhere; AREDS age related eye disease study; ARMD age related macular degeneration; POAG primary  open angle glaucoma; EBMD epithelial/anterior basement membrane dystrophy; ACIOL anterior chamber intraocular lens; IOL intraocular lens; PCIOL posterior chamber intraocular lens; Phaco/IOL phacoemulsification with intraocular lens placement; PRK photorefractive keratectomy; LASIK laser assisted in situ keratomileusis; HTN hypertension; DM diabetes mellitus; COPD chronic obstructive pulmonary disease  

## 2020-11-25 ENCOUNTER — Other Ambulatory Visit: Payer: Self-pay | Admitting: Family Medicine

## 2020-11-26 NOTE — Telephone Encounter (Signed)
Requesting: ALPRAZOLAM Contract: NONE UDS: NONE Last Visit: 10/25/20  Next Visit: 01/28/21 Last Refill: 09/28/20  Please Advise

## 2021-01-07 ENCOUNTER — Other Ambulatory Visit: Payer: Self-pay | Admitting: Family Medicine

## 2021-01-10 NOTE — Telephone Encounter (Signed)
Requesting: alprazolam 0.25mg  Contract: None OEC:[id number] Last Visit: 10/25/20 Next Visit: 01/28/21 Last Refill: 11/26/20 # 30, 0 RF  Please Advise

## 2021-01-28 ENCOUNTER — Ambulatory Visit (INDEPENDENT_AMBULATORY_CARE_PROVIDER_SITE_OTHER): Payer: BC Managed Care – PPO | Admitting: Family Medicine

## 2021-01-28 ENCOUNTER — Other Ambulatory Visit: Payer: Self-pay

## 2021-01-28 ENCOUNTER — Encounter: Payer: Self-pay | Admitting: Family Medicine

## 2021-01-28 VITALS — BP 110/84 | HR 56 | Temp 98.2°F | Resp 18 | Ht 64.0 in | Wt 178.8 lb

## 2021-01-28 DIAGNOSIS — Z23 Encounter for immunization: Secondary | ICD-10-CM | POA: Diagnosis not present

## 2021-01-28 DIAGNOSIS — Z01419 Encounter for gynecological examination (general) (routine) without abnormal findings: Secondary | ICD-10-CM | POA: Diagnosis not present

## 2021-01-28 DIAGNOSIS — F419 Anxiety disorder, unspecified: Secondary | ICD-10-CM

## 2021-01-28 DIAGNOSIS — Z1159 Encounter for screening for other viral diseases: Secondary | ICD-10-CM

## 2021-01-28 DIAGNOSIS — Z0000 Encounter for general adult medical examination without abnormal findings: Secondary | ICD-10-CM | POA: Insufficient documentation

## 2021-01-28 DIAGNOSIS — M3501 Sicca syndrome with keratoconjunctivitis: Secondary | ICD-10-CM

## 2021-01-28 DIAGNOSIS — I10 Essential (primary) hypertension: Secondary | ICD-10-CM

## 2021-01-28 LAB — LIPID PANEL
Cholesterol: 315 mg/dL — ABNORMAL HIGH (ref 0–200)
HDL: 89.9 mg/dL (ref >39.00)
LDL Cholesterol: 206 mg/dL — ABNORMAL HIGH (ref 0–99)
NonHDL: 224.7
Total CHOL/HDL Ratio: 3
Triglycerides: 93 mg/dL (ref 0.0–149.0)
VLDL: 18.6 mg/dL (ref 0.0–40.0)

## 2021-01-28 LAB — TSH: TSH: 1.86 u[IU]/mL (ref 0.35–5.50)

## 2021-01-28 MED ORDER — ALPRAZOLAM 0.25 MG PO TABS: 0.2500 mg | ORAL_TABLET | Freq: Three times a day (TID) | ORAL | 0 refills | Status: DC | PRN

## 2021-01-28 NOTE — Progress Notes (Addendum)
Subjective:   By signing my name below, I, Zite Okoli, attest that this documentation has been prepared under the direction and in the presence of Roma Schanz R DO. 01/28/2021     Patient ID: [name], female    DOB: August 25, 1960, 60 y.o.   MRN: [id number]  Chief Complaint  Patient presents with   Annual Exam    Pt states fasting     HPI Patient is in today for a comprehensive physical exam.  She was referred to an ophthalmologist and was diagnosed with uveitis after complaining of eye pain, redness and blurry vision. She was prescribed 50 mg imuran and prednisone drops and is doing well on it. She mentions her vision has been better.  She also reports joint pain in her right thumb. She has tried to wear a hand brace but the pain is persistent. She works on a laptop all day.   She has a mole on her right foot that she is wary about. She has been watching it and it has not changed.  She is requesting for a refill on 0.25 mg xanax.   She denies fever, hearing loss, ear pain,congestion, sinus pain, sore throat, eye pain, chest pain, palpitations, cough, shortness of breath, wheezing, nausea. vomiting, diarrhea, constipation, blood in stool, dysuria,frequency, hematuria and headaches.   She will like a referral to gynecology.   She has received her flu vaccine. She has 4 Pfizer Covid-19 vaccines at this time. She is not UTD on the shingles or tetanus vaccines. She will receive the 1st dose of the shingles vaccine today.  She is exercising regularly. She visits a Restaurant manager, fast food.  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 Melvyn Novas   Blood transfusion without reported diagnosis    years ago (2003)   GERD (gastroesophageal reflux disease)    History of colonic polyps    Hyperglycemia    Hyperlipidemia    Villous adenoma 1997    Past Surgical History:  Procedure Laterality  Date   ABDOMINAL HYSTERECTOMY     &USO for fibroid & servere anemia   APPENDECTOMY     COLONOSCOPY  2010   FETAL BLOOD TRANSFUSION  03/2001   pre tah   PARTIAL COLECTOMY  1997   for "precancerous "polyps , Dr Benjamine Sprague, [place]   TONSILLECTOMY      Family History  Problem Relation Age of Onset   Colon cancer Father 44   Leukemia Father    Hyperlipidemia Brother    Hypertension Brother    Colon cancer Maternal Aunt    Diabetes Paternal Uncle    Asthma Maternal Grandmother    Hypertension Mother    Colon polyps Neg Hx    Esophageal cancer Neg Hx    Rectal cancer Neg Hx    Stomach cancer Neg Hx     Social History   Socioeconomic History   Marital status: Single    Spouse name: Not on file   Number of children: Not on file   Years of education: Not on file   Highest education level: Not on file  Occupational History   Occupation: Pharmacist, hospital    Employer: [employer]  Tobacco Use   Smoking status: Former    Types: Cigarettes    Quit date: 03/27/2001    Years since quitting: 19.8   Smokeless tobacco: Never  Tobacco comments:    pt states only smoked socially and unsure how many years she smoked  Vaping Use   Vaping Use: Never used  Substance and Sexual Activity   Alcohol use: No   Drug use: Never   Sexual activity: Not on file  Other Topics Concern   Not on file  Social History Narrative   Gets regular exercise         Social Determinants of Health   Financial Resource Strain: Not on file  Food Insecurity: Not on file  Transportation Needs: Not on file  Physical Activity: Not on file  Stress: Not on file  Social Connections: Not on file  Intimate Partner Violence: Not on file    Outpatient Medications Prior to Visit  Medication Sig Dispense Refill   azaTHIOprine (IMURAN) 50 MG tablet Take 1 tablet by mouth daily.     cetirizine (ZYRTEC) 10 MG tablet Take 10 mg by mouth daily as needed for allergies.     cyclobenzaprine (FLEXERIL) 10 MG tablet  Take 1 tablet by mouth three times daily as needed for muscle spasm 30 tablet 0   ALPRAZolam (XANAX) 0.25 MG tablet Take 1 tablet by mouth three times daily as needed 30 tablet 0   No facility-administered medications prior to visit.    Allergies  Allergen Reactions   Codeine     REACTION: GI UPSET    Review of Systems  Constitutional:  Negative for fever.  HENT:  Negative for congestion, ear pain, hearing loss, sinus pain and sore throat.   Eyes:  Negative for blurred vision and pain.  Respiratory:  Negative for cough, sputum production, shortness of breath and wheezing.   Cardiovascular:  Negative for chest pain and palpitations.  Gastrointestinal:  Negative for blood in stool, constipation, diarrhea, nausea and vomiting.  Genitourinary:  Negative for dysuria, frequency, hematuria and urgency.  Musculoskeletal:  Positive for joint pain (right thumb). Negative for back pain, falls and myalgias.  Neurological:  Negative for dizziness, sensory change, loss of consciousness, weakness and headaches.  Endo/Heme/Allergies:  Negative for environmental allergies. Does not bruise/bleed easily.  Psychiatric/Behavioral:  Negative for depression and suicidal ideas. The patient is not nervous/anxious and does not have insomnia.       Objective:    Physical Exam Constitutional:      General: She is not in acute distress.    Appearance: Normal appearance. She is not ill-appearing.  HENT:     Head: Normocephalic and atraumatic.     Right Ear: Tympanic membrane, ear canal and external ear normal.     Left Ear: Tympanic membrane, ear canal and external ear normal.     Ears:     Comments: Mild cerumen in right ear Eyes:     Extraocular Movements: Extraocular movements intact.     Pupils: Pupils are equal, round, and reactive to light.  Cardiovascular:     Rate and Rhythm: Normal rate and regular rhythm.     Pulses: Normal pulses.     Heart sounds: Normal heart sounds. No murmur heard.   No  gallop.  Pulmonary:     Effort: Pulmonary effort is normal. No respiratory distress.     Breath sounds: Normal breath sounds. No wheezing, rhonchi or rales.  Abdominal:     General: Bowel sounds are normal. There is no distension.     Palpations: Abdomen is soft. There is no mass.     Tenderness: There is no abdominal tenderness. There is no guarding or  rebound.     Hernia: No hernia is present.  Musculoskeletal:     Cervical back: Normal range of motion and neck supple.  Feet:     Comments: Flat mole on right foot Lymphadenopathy:     Cervical: No cervical adenopathy.  Skin:    General: Skin is warm and dry.  Neurological:     Mental Status: She is alert and oriented to person, place, and time.  Psychiatric:        Behavior: Behavior normal.    BP 110/84 (BP Location: Left Arm, Patient Position: Sitting, Cuff Size: Normal)   Pulse (!) 56   Temp 98.2 F (36.8 C) (Oral)   Resp 18   Ht 5\' 4"  (1.626 m)   Wt 178 lb 12.8 oz (81.1 kg)   LMP 03/27/2001   SpO2 96%   BMI 30.69 kg/m  Wt Readings from Last 3 Encounters:  01/28/21 178 lb 12.8 oz (81.1 kg)  10/25/20 177 lb 3.2 oz (80.4 kg)  07/06/20 179 lb 6 oz (81.4 kg)    Diabetic Foot Exam - Simple   No data filed    Lab Results  Component Value Date   WBC 5.6 09/18/2019   HGB 14.3 09/18/2019   HCT 41.1 09/18/2019   PLT 254.0 09/18/2019   GLUCOSE 91 12/15/2019   CHOL 265 (H) 12/15/2019   TRIG 65 12/15/2019   HDL 82 12/15/2019   LDLDIRECT 145.6 12/08/2008   LDLCALC 167 (H) 12/15/2019   ALT 17 12/15/2019   AST 19 12/15/2019   NA 140 12/15/2019   K 4.3 12/15/2019   CL 105 12/15/2019   CREATININE 1.34 (H) 12/15/2019   BUN 25 12/15/2019   CO2 27 12/15/2019   TSH 1.50 12/10/2018    Lab Results  Component Value Date   TSH 1.50 12/10/2018   Lab Results  Component Value Date   WBC 5.6 09/18/2019   HGB 14.3 09/18/2019   HCT 41.1 09/18/2019   MCV 84.4 09/18/2019   PLT 254.0 09/18/2019   Lab Results   Component Value Date   NA 140 12/15/2019   K 4.3 12/15/2019   CO2 27 12/15/2019   GLUCOSE 91 12/15/2019   BUN 25 12/15/2019   CREATININE 1.34 (H) 12/15/2019   BILITOT 1.1 12/15/2019   ALKPHOS 59 09/18/2019   AST 19 12/15/2019   ALT 17 12/15/2019   PROT 7.2 12/15/2019   ALBUMIN 4.4 09/18/2019   CALCIUM 9.5 12/15/2019   GFR 61.45 09/18/2019   Lab Results  Component Value Date   CHOL 265 (H) 12/15/2019   Lab Results  Component Value Date   HDL 82 12/15/2019   Lab Results  Component Value Date   LDLCALC 167 (H) 12/15/2019   Lab Results  Component Value Date   TRIG 65 12/15/2019   Lab Results  Component Value Date   CHOLHDL 3.2 12/15/2019   No results found for: HGBA1C     Mammogram: Last completed on 02/06/2019. Results were normal. Repeat in 1 year. Colonoscopy: Two diminutive polyps were found in the sigmoid and descending colon and were resected and retrieved.Otherwise exam was normal. Patient has a history of colonic polyp villous adenoma. Repeat in 5 years.    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    Dispense:  30 tablet     Refill:  0    I,Zite Okoli,acting as a scribe for [employer], DO.,have documented all relevant documentation on the behalf of Ann Held, DO,as directed by  Ann Held, DO while in the presence of Ann Held, DO.   I,  Ann Held DO., personally preformed the services described in this documentation.  All medical record entries made by the scribe were at my direction and in my presence.  I have reviewed the chart and discharge instructions (if applicable) and agree that the record reflects my personal performance and is accurate and complete. 01/28/2021

## 2021-01-28 NOTE — Assessment & Plan Note (Signed)
Per [facility] 

## 2021-01-28 NOTE — Patient Instructions (Signed)
Preventive Care 92-60 Years Old, Female Preventive care refers to lifestyle choices and visits with your health care provider that can promote health and wellness. Preventive care visits are also called wellness exams. What can I expect for my preventive care visit? Counseling Your health care provider may ask you questions about your: Medical history, including: Past medical problems. Family medical history. Pregnancy history. Current health, including: Menstrual cycle. Method of birth control. Emotional well-being. Home life and relationship well-being. Sexual activity and sexual health. Lifestyle, including: Alcohol, nicotine or tobacco, and drug use. Access to firearms. Diet, exercise, and sleep habits. Work and work Statistician. Sunscreen use. Safety issues such as seatbelt and bike helmet use. Physical exam Your health care provider will check your: Height and weight. These may be used to calculate your BMI (body mass index). BMI is a measurement that tells if you are at a healthy weight. Waist circumference. This measures the distance around your waistline. This measurement also tells if you are at a healthy weight and may help predict your risk of certain diseases, such as type 2 diabetes and high blood pressure. Heart rate and blood pressure. Body temperature. Skin for abnormal spots. What immunizations do I need? Vaccines are usually given at various ages, according to a schedule. Your health care provider will recommend vaccines for you based on your age, medical history, and lifestyle or other factors, such as travel or where you work. What tests do I need? Screening Your health care provider may recommend screening tests for certain conditions. This may include: Lipid and cholesterol levels. Diabetes screening. This is done by checking your blood sugar (glucose) after you have not eaten for a while (fasting). Pelvic exam and Pap test. Hepatitis B test. Hepatitis C  test. HIV (human immunodeficiency virus) test. STI (sexually transmitted infection) testing, if you are at risk. Lung cancer screening. Colorectal cancer screening. Mammogram. Talk with your health care provider about when you should start having regular mammograms. This may depend on whether you have a family history of breast cancer. BRCA-related cancer screening. This may be done if you have a family history of breast, ovarian, tubal, or peritoneal cancers. Bone density scan. This is done to screen for osteoporosis. Talk with your health care provider about your test results, treatment options, and if necessary, the need for more tests. Follow these instructions at home: Eating and drinking  Eat a diet that includes fresh fruits and vegetables, whole grains, lean protein, and low-fat dairy products. Take vitamin and mineral supplements as recommended by your health care provider. Do not drink alcohol if: Your health care provider tells you not to drink. You are pregnant, may be pregnant, or are planning to become pregnant. If you drink alcohol: Limit how much you have to 0-1 drink a day. Know how much alcohol is in your drink. In the [place] one drink equals one 12 oz bottle of beer (355 mL), one 5 oz glass of wine (148 mL), or one 1 oz glass of hard liquor (44 mL). Lifestyle Brush your teeth every morning and night with fluoride toothpaste. Floss one time each day. Exercise for at least 30 minutes 5 or more days each week. Do not use any products that contain nicotine or tobacco. These products include cigarettes, chewing tobacco, and vaping devices, such as e-cigarettes. If you need help quitting, ask your health care provider. Do not use drugs. If you are sexually active, practice safe sex. Use a condom or other form of protection to prevent  STIs. If you do not wish to become pregnant, use a form of birth control. If you plan to become pregnant, see your health care provider for a  prepregnancy visit. Take aspirin only as told by your health care provider. Make sure that you understand how much to take and what form to take. Work with your health care provider to find out whether it is safe and beneficial for you to take aspirin daily. Find healthy ways to manage stress, such as: Meditation, yoga, or listening to music. Journaling. Talking to a trusted person. Spending time with friends and family. Minimize exposure to UV radiation to reduce your risk of skin cancer. Safety Always wear your seat belt while driving or riding in a vehicle. Do not drive: If you have been drinking alcohol. Do not ride with someone who has been drinking. When you are tired or distracted. While texting. If you have been using any mind-altering substances or drugs. Wear a helmet and other protective equipment during sports activities. If you have firearms in your house, make sure you follow all gun safety procedures. Seek help if you have been physically or sexually abused. What's next? Visit your health care provider once a year for an annual wellness visit. Ask your health care provider how often you should have your eyes and teeth checked. Stay up to date on all vaccines. This information is not intended to replace advice given to you by your health care provider. Make sure you discuss any questions you have with your health care provider. Document Revised: 09/08/2020 Document Reviewed: 09/08/2020 Elsevier Patient Education  [place].

## 2021-01-28 NOTE — Assessment & Plan Note (Signed)
ghm utd Check labs  See avs  

## 2021-01-31 LAB — HEPATITIS C ANTIBODY
Hepatitis C Ab: NONREACTIVE
SIGNAL TO CUT-OFF: 0.07 (ref <1.00)

## 2021-03-03 ENCOUNTER — Other Ambulatory Visit: Payer: Self-pay | Admitting: Family Medicine

## 2021-03-03 DIAGNOSIS — M5442 Lumbago with sciatica, left side: Secondary | ICD-10-CM

## 2021-03-24 NOTE — Progress Notes (Signed)
Patient here for second shingles vaccine.  Vaccine given in left deltoid and patient tolerated well.  

## 2021-03-25 ENCOUNTER — Ambulatory Visit (INDEPENDENT_AMBULATORY_CARE_PROVIDER_SITE_OTHER): Payer: BC Managed Care – PPO | Admitting: *Deleted

## 2021-03-25 DIAGNOSIS — Z23 Encounter for immunization: Secondary | ICD-10-CM

## 2021-04-18 ENCOUNTER — Other Ambulatory Visit: Payer: Self-pay

## 2021-04-18 ENCOUNTER — Encounter: Payer: Self-pay | Admitting: Family Medicine

## 2021-04-18 ENCOUNTER — Other Ambulatory Visit: Payer: Self-pay | Admitting: Family Medicine

## 2021-04-18 DIAGNOSIS — R1013 Epigastric pain: Secondary | ICD-10-CM

## 2021-04-18 DIAGNOSIS — F419 Anxiety disorder, unspecified: Secondary | ICD-10-CM

## 2021-04-18 MED ORDER — FAMOTIDINE 20 MG PO TABS: 20.0000 mg | ORAL_TABLET | Freq: Two times a day (BID) | ORAL | 2 refills | Status: DC

## 2021-04-18 MED ORDER — ALPRAZOLAM 0.25 MG PO TABS: 0.2500 mg | ORAL_TABLET | Freq: Three times a day (TID) | ORAL | 0 refills | Status: DC | PRN

## 2021-04-18 NOTE — Telephone Encounter (Signed)
Pt requesting heartburn medication.  Previously has been prescribed:   Famatodine 20mg --d/c'd on 10/25/20   Omeprazole 62mt--d/c'd on 01/16/19  Last OV was 01/28/21 for physical.   Would you like her to come in for visit before prescribing?

## 2021-04-18 NOTE — Telephone Encounter (Signed)
Requesting: alprazolam 0.25mg  Contract: None UDS: None Last Visit: 01/28/2021 Next Visit: None Last Refill: 01/28/2021 #30 and 0RF  Please Advise

## 2021-05-29 ENCOUNTER — Other Ambulatory Visit: Payer: Self-pay | Admitting: Family Medicine

## 2021-05-29 DIAGNOSIS — F419 Anxiety disorder, unspecified: Secondary | ICD-10-CM

## 2021-05-30 ENCOUNTER — Encounter: Payer: BC Managed Care – PPO | Admitting: Obstetrics and Gynecology

## 2021-05-30 NOTE — Telephone Encounter (Signed)
Requesting: xanax 0.'25MG'$  ?Contract: none  ?UDS: none ?Last Visit:01/28/21 ?Next Visit: not scheduled ?Last Refill:  04/18/21, #30, 0 refills ? ?Please Advise  ?

## 2021-06-16 IMAGING — DX DG CHEST 2V
2 series · 2 of 2 positions shown · non-contrast
Comparison: 12/10/2018

CLINICAL DATA: Pneumonia follow-up

EXAM:
CHEST - 2 VIEW

[chest pa]
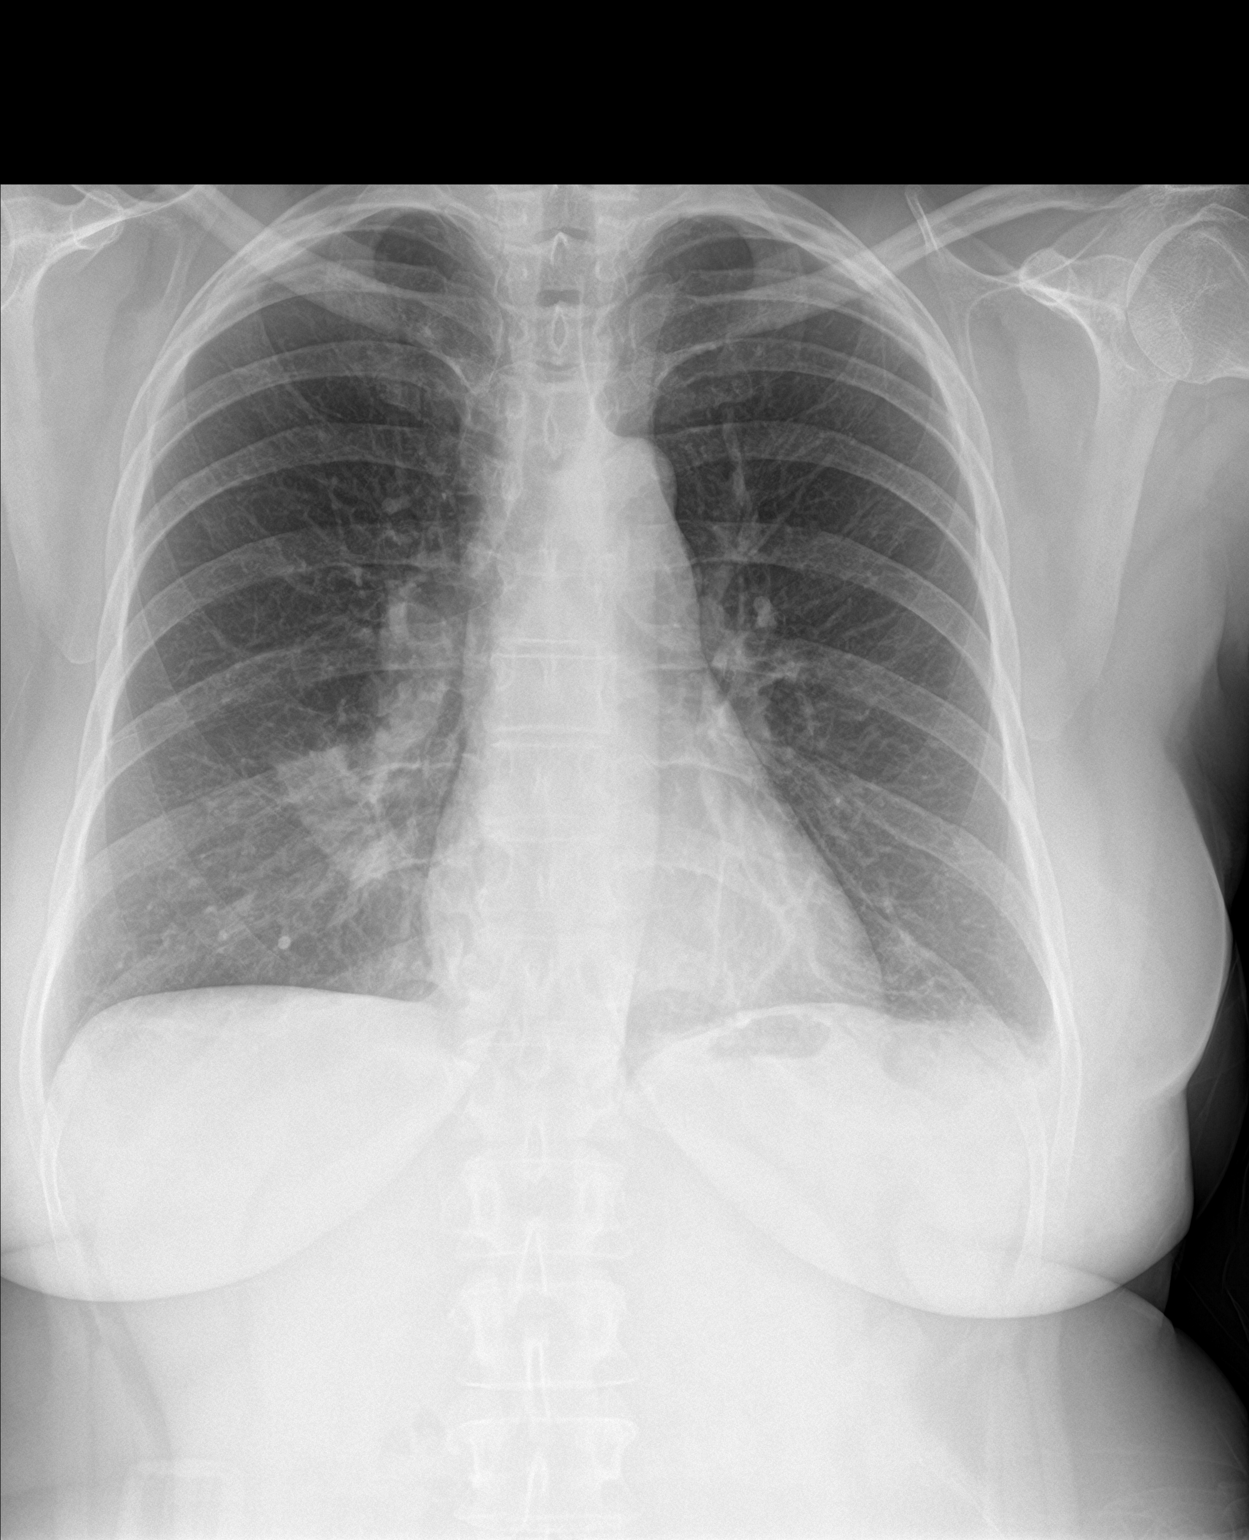

[chest lat]
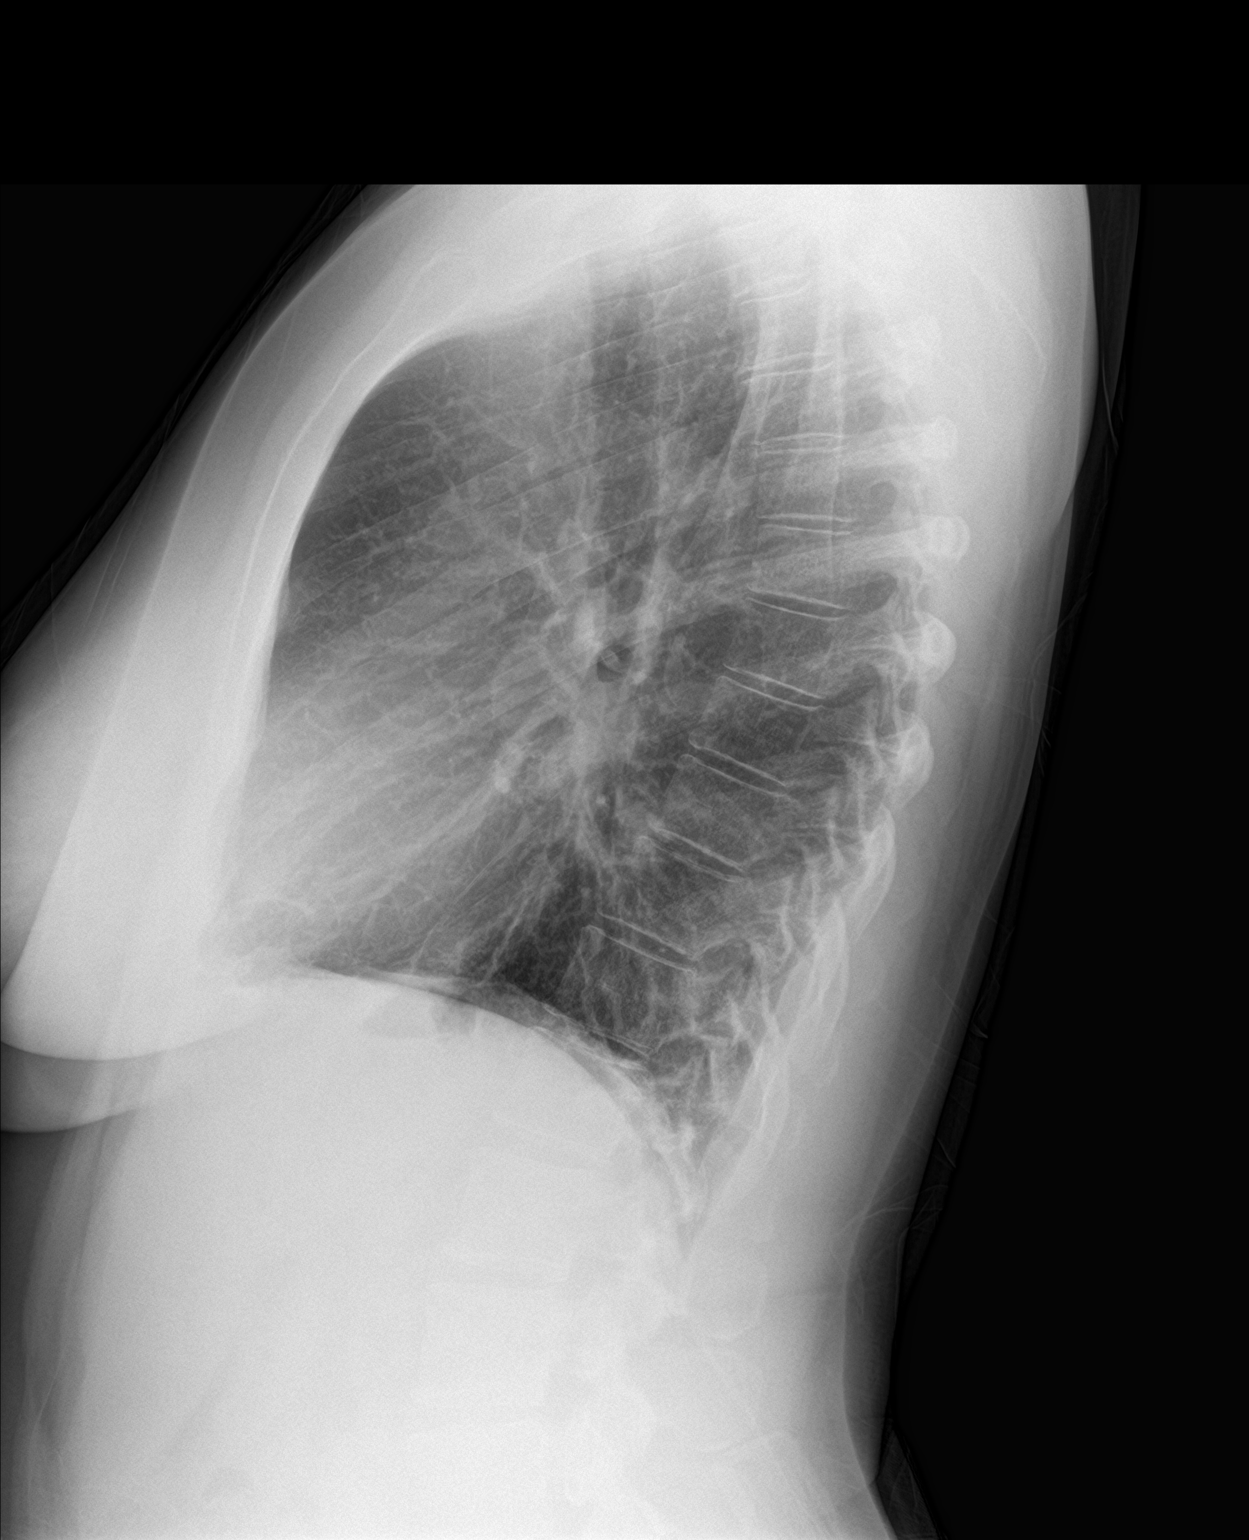

[2 of 2 positions shown; findings below may reference images not displayed]

FINDINGS: There is a persistent right-sided infrahilar/perihilar airspace
opacity which has not improved since prior study. There is a
persistent opacity at the left lung base with blunting of the left
costophrenic angle. There is no pneumothorax or new pulmonary
opacity.
IMPRESSION: Persistent unchanged opacities in the right perihilar region and at
the left lung base. Follow-up with CT is recommended given limited
interval change.

## 2021-07-25 ENCOUNTER — Encounter: Payer: BC Managed Care – PPO | Admitting: Obstetrics and Gynecology

## 2021-07-31 ENCOUNTER — Other Ambulatory Visit: Payer: Self-pay | Admitting: Family Medicine

## 2021-07-31 DIAGNOSIS — M5442 Lumbago with sciatica, left side: Secondary | ICD-10-CM

## 2021-10-01 ENCOUNTER — Other Ambulatory Visit: Payer: Self-pay | Admitting: Family Medicine

## 2021-10-01 DIAGNOSIS — F419 Anxiety disorder, unspecified: Secondary | ICD-10-CM

## 2021-10-03 NOTE — Progress Notes (Deleted)
   ANNUAL EXAM Patient name: [name] MRN [id number]  Date of birth: 1961/03/16 Chief Complaint:   No chief complaint on file.  History of Present Illness:   [name] is a 61 y.o. G*** female being seen today for a routine annual exam.   Current complaints: ***  Patient's last menstrual period was 03/27/2001.   The pregnancy intention screening data noted above was reviewed. Potential methods of contraception were discussed. The patient elected to proceed with No data recorded.   Last pap 2011. Results were:  normal per pt . H/O abnormal pap: {yes/yes***/no:23866} Health Maintenance Due  Topic Date Due   HIV Screening  Never done   PAP SMEAR-Modifier  03/17/2010   TETANUS/TDAP  03/28/2011   MAMMOGRAM  02/05/2021        10/25/2020    4:18 PM 07/06/2020    1:01 PM 09/18/2019   10:35 AM  Depression screen PHQ 2/9  Decreased Interest 0 0 0  Down, Depressed, Hopeless 0 0 0  PHQ - 2 Score 0 0 0         No data to display           Review of Systems:   Pertinent items are noted in HPI Denies any headaches, blurred vision, fatigue, shortness of breath, chest pain, abdominal pain, abnormal vaginal discharge/itching/odor/irritation, problems with periods, bowel movements, urination, or intercourse unless otherwise stated above. *** Pertinent History Reviewed:  Reviewed past medical,surgical, social and family history.  Reviewed problem list, medications and allergies. Physical Assessment:  There were no vitals filed for this visit.There is no height or weight on file to calculate BMI.   Physical Examination:  General appearance - well appearing, and in no distress Mental status - alert, oriented to person, place, and time Psych:  She has a normal mood and affect Skin - warm and dry, normal color, no suspicious lesions noted Chest - effort normal, all lung fields clear to auscultation bilaterally Heart - normal rate and regular rhythm Neck:  midline trachea, no  thyromegaly or nodules Breasts - breasts appear normal, no suspicious masses, no skin or nipple changes or axillary nodes Abdomen - soft, nontender, nondistended, no masses or organomegaly Pelvic -  VULVA: normal appearing vulva with no masses, tenderness or lesions  VAGINA: normal appearing vagina with normal color and discharge, no lesions   CERVIX: normal appearing cervix without discharge or lesions, no CMT UTERUS: uterus is felt to be normal size, shape, consistency and nontender  ADNEXA: No adnexal masses or tenderness noted. Extremities:  No swelling or varicosities noted  Chaperone present for exam  No results found for this or any previous visit (from the past 24 hour(s)).  Assessment & Plan:  Diagnoses and all orders for this visit:  Encounter for annual routine gynecological examination  - Cervical cancer screening: Discussed guidelines. Pap with HPV done *** - Breast Health: Encouraged self breast awareness/SBE. Discussed limits of clinical breast exam for detecting breast cancer. Discussed importance of annual MXR.  Rx given *** - Climacteric/Sexual health: Reviewed typical and atypical symptoms of menopause/peri-menopause. Discussed PMB and to call if any amount of spotting.  - Colonoscopy: {Blank single:19197::"Per PCP","up to date","declines"} - F/U 12 months and prn     No orders of the defined types were placed in this encounter.   Meds: No orders of the defined types were placed in this encounter.   Follow-up: No follow-ups on file.  Radene Gunning, MD 10/03/2021 7:34 PM

## 2021-10-07 ENCOUNTER — Encounter: Payer: BC Managed Care – PPO | Admitting: Obstetrics and Gynecology

## 2021-10-07 DIAGNOSIS — Z01419 Encounter for gynecological examination (general) (routine) without abnormal findings: Secondary | ICD-10-CM

## 2021-11-08 ENCOUNTER — Ambulatory Visit (INDEPENDENT_AMBULATORY_CARE_PROVIDER_SITE_OTHER): Payer: BC Managed Care – PPO | Admitting: Family Medicine

## 2021-11-08 ENCOUNTER — Encounter: Payer: Self-pay | Admitting: Family Medicine

## 2021-11-08 VITALS — BP 120/82 | HR 78 | Temp 97.5°F | Resp 18 | Ht 64.0 in | Wt 182.0 lb

## 2021-11-08 DIAGNOSIS — F419 Anxiety disorder, unspecified: Secondary | ICD-10-CM | POA: Diagnosis not present

## 2021-11-08 DIAGNOSIS — M3501 Sicca syndrome with keratoconjunctivitis: Secondary | ICD-10-CM

## 2021-11-08 DIAGNOSIS — E785 Hyperlipidemia, unspecified: Secondary | ICD-10-CM | POA: Diagnosis not present

## 2021-11-08 DIAGNOSIS — R109 Unspecified abdominal pain: Secondary | ICD-10-CM

## 2021-11-08 DIAGNOSIS — I10 Essential (primary) hypertension: Secondary | ICD-10-CM | POA: Diagnosis not present

## 2021-11-08 LAB — POC URINALSYSI DIPSTICK (AUTOMATED)
Bilirubin, UA: NEGATIVE
Blood, UA: NEGATIVE
Glucose, UA: NEGATIVE
Ketones, UA: NEGATIVE
Leukocytes, UA: NEGATIVE
Nitrite, UA: NEGATIVE
Protein, UA: NEGATIVE
Spec Grav, UA: 1.015 (ref 1.010–1.025)
Urobilinogen, UA: 0.2 E.U./dL
pH, UA: 6 (ref 5.0–8.0)

## 2021-11-08 MED ORDER — ALPRAZOLAM 0.25 MG PO TABS: 0.2500 mg | ORAL_TABLET | Freq: Three times a day (TID) | ORAL | 0 refills | Status: DC | PRN

## 2021-11-08 NOTE — Assessment & Plan Note (Signed)
stable °

## 2021-11-08 NOTE — Assessment & Plan Note (Signed)
Encourage heart healthy diet such as MIND or DASH diet, increase exercise, avoid trans fats, simple carbohydrates and processed foods, consider a krill or fish or flaxseed oil cap daily.  °

## 2021-11-08 NOTE — Assessment & Plan Note (Signed)
Per [facility]

## 2021-11-08 NOTE — Progress Notes (Signed)
Subjective:   By signing my name below, I, Shehryar Baig, attest that this documentation has been prepared under the direction and in the presence of Dr. Roma Schanz, DO. 11/08/2021    Patient ID: [name], female    DOB: 1960-12-29, 61 y.o.   MRN: [id number]  Chief Complaint  Patient presents with   Abdominal Pain    Pt states having abdominal pain and lower back pain, x2 weeks. No burning or freq.     Abdominal Pain Pertinent negatives include no dysuria, fever, frequency, headaches or nausea.   Patient is in today for a office visit.   She complains of abdominal pain and lower back pain for the past 2 weeks. She denies having any burning or frequency. She notes her lower back pain is different from her typical back pain. She has taken an at home UTI test and reports the results showed trace bacteria. No vaginal itching or d/c .  No fever .   She is requesting a refill for Xanax.    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 Melvyn Novas   Blood transfusion without reported diagnosis    years ago (2003)   GERD (gastroesophageal reflux disease)    History of colonic polyps    Hyperglycemia    Hyperlipidemia    Villous adenoma 1997    Past Surgical History:  Procedure Laterality Date   ABDOMINAL HYSTERECTOMY     &USO for fibroid & servere anemia   APPENDECTOMY     COLONOSCOPY  2010   FETAL BLOOD TRANSFUSION  03/2001   pre tah   PARTIAL COLECTOMY  1997   for "precancerous "polyps , Dr Benjamine Sprague, [place]   TONSILLECTOMY      Family History  Problem Relation Age of Onset   Colon cancer Father 10   Leukemia Father    Hyperlipidemia Brother    Hypertension Brother    Colon cancer Maternal Aunt    Diabetes Paternal Uncle    Asthma Maternal Grandmother    Hypertension Mother    Colon polyps Neg Hx    Esophageal cancer Neg Hx    Rectal cancer Neg Hx    Stomach  cancer Neg Hx     Social History   Socioeconomic History   Marital status: Single    Spouse name: Not on file   Number of children: Not on file   Years of education: Not on file   Highest education level: Not on file  Occupational History   Occupation: Product manager: [employer]  Tobacco Use   Smoking status: Former    Types: Cigarettes    Quit date: 03/27/2001    Years since quitting: 20.6   Smokeless tobacco: Never   Tobacco comments:    pt states only smoked socially and unsure how many years she smoked  Vaping Use   Vaping Use: Never used  Substance and Sexual Activity   Alcohol use: No   Drug use: Never   Sexual activity: Not on file  Other Topics Concern   Not on file  Social History Narrative   Gets regular exercise         Social Determinants of Health   Financial Resource Strain: Not on file  Food Insecurity: Not on file  Transportation Needs: Not on file  Physical Activity: Not on  file  Stress: Not on file  Social Connections: Not on file  Intimate Partner Violence: Not on file    Outpatient Medications Prior to Visit  Medication Sig Dispense Refill   azaTHIOprine (IMURAN) 50 MG tablet Take 1 tablet by mouth daily.     cyclobenzaprine (FLEXERIL) 10 MG tablet Take 1 tablet by mouth three times daily as needed for muscle spasm 30 tablet 0   famotidine (PEPCID) 20 MG tablet Take 1 tablet (20 mg total) by mouth 2 (two) times daily. 60 tablet 2   prednisoLONE acetate (PRED FORTE) 1 % ophthalmic suspension 1 drop 4 (four) times daily. 5 mL 0   ALPRAZolam (XANAX) 0.25 MG tablet Take 1 tablet by mouth three times daily as needed 30 tablet 0   cetirizine (ZYRTEC) 10 MG tablet Take 10 mg by mouth daily as needed for allergies.     No facility-administered medications prior to visit.    Allergies  Allergen Reactions   Codeine     REACTION: GI UPSET    Review of Systems  Constitutional:  Negative for chills, fever and malaise/fatigue.  HENT:   Negative for congestion.   Eyes:  Negative for blurred vision.  Respiratory:  Negative for shortness of breath.   Cardiovascular:  Negative for chest pain, palpitations and leg swelling.  Gastrointestinal:  Positive for abdominal pain. Negative for blood in stool and nausea.  Genitourinary:  Negative for dysuria and frequency.  Musculoskeletal:  Positive for back pain. Negative for falls.  Skin:  Negative for rash.  Neurological:  Negative for dizziness, loss of consciousness and headaches.  Endo/Heme/Allergies:  Negative for environmental allergies.  Psychiatric/Behavioral:  Negative for depression. The patient is not nervous/anxious.        Objective:    Physical Exam Vitals and nursing note reviewed.  Constitutional:      General: She is not in acute distress.    Appearance: Normal appearance. She is not ill-appearing.  HENT:     Head: Normocephalic and atraumatic.     Right Ear: External ear normal.     Left Ear: External ear normal.  Eyes:     Extraocular Movements: Extraocular movements intact.     Pupils: Pupils are equal, round, and reactive to light.  Cardiovascular:     Rate and Rhythm: Normal rate and regular rhythm.     Heart sounds: Normal heart sounds. No murmur heard.    No gallop.  Pulmonary:     Effort: Pulmonary effort is normal. No respiratory distress.     Breath sounds: Normal breath sounds. No wheezing or rales.  Abdominal:     Palpations: Abdomen is soft.     Tenderness: There is no abdominal tenderness.  Genitourinary:    Comments: UTI results were negative Musculoskeletal:     Lumbar back: No tenderness.  Skin:    General: Skin is warm and dry.  Neurological:     Mental Status: She is alert and oriented to person, place, and time.  Psychiatric:        Judgment: Judgment normal.     BP 120/82 (BP Location: Left Arm, Patient Position: Sitting, Cuff Size: Normal)   Pulse 78   Temp (!) 97.5 F (36.4 C) (Oral)   Resp 18   Ht '5\' 4"'$  (1.626 m)    Wt 182 lb (82.6 kg)   LMP 03/27/2001   SpO2 97%   BMI 31.24 kg/m  Wt Readings from Last 3 Encounters:  11/08/21 182 lb (82.6 kg)  01/28/21  178 lb 12.8 oz (81.1 kg)  10/25/20 177 lb 3.2 oz (80.4 kg)    Diabetic Foot Exam - Simple   No data filed    Lab Results  Component Value Date   WBC 5.6 09/18/2019   HGB 14.3 09/18/2019   HCT 41.1 09/18/2019   PLT 254.0 09/18/2019   GLUCOSE 91 12/15/2019   CHOL 315 (H) 01/28/2021   TRIG 93.0 01/28/2021   HDL 89.90 01/28/2021   LDLDIRECT 145.6 12/08/2008   LDLCALC 206 (H) 01/28/2021   ALT 17 12/15/2019   AST 19 12/15/2019   NA 140 12/15/2019   K 4.3 12/15/2019   CL 105 12/15/2019   CREATININE 1.34 (H) 12/15/2019   BUN 25 12/15/2019   CO2 27 12/15/2019   TSH 1.86 01/28/2021    Lab Results  Component Value Date   TSH 1.86 01/28/2021   Lab Results  Component Value Date   WBC 5.6 09/18/2019   HGB 14.3 09/18/2019   HCT 41.1 09/18/2019   MCV 84.4 09/18/2019   PLT 254.0 09/18/2019   Lab Results  Component Value Date   NA 140 12/15/2019   K 4.3 12/15/2019   CO2 27 12/15/2019   GLUCOSE 91 12/15/2019   BUN 25 12/15/2019   CREATININE 1.34 (H) 12/15/2019   BILITOT 1.1 12/15/2019   ALKPHOS 59 09/18/2019   AST 19 12/15/2019   ALT 17 12/15/2019   PROT 7.2 12/15/2019   ALBUMIN 4.4 09/18/2019   CALCIUM 9.5 12/15/2019   GFR 61.45 09/18/2019   Lab Results  Component Value Date   CHOL 315 (H) 01/28/2021   Lab Results  Component Value Date   HDL 89.90 01/28/2021   Lab Results  Component Value Date   LDLCALC 206 (H) 01/28/2021   Lab Results  Component Value Date   TRIG 93.0 01/28/2021   Lab Results  Component Value Date   CHOLHDL 3 01/28/2021   No results found for: "HGBA1C"     Assessment & Plan:   Problem List Items Addressed This Visit       Unprioritized   Sjogren's syndrome with keratoconjunctivitis sicca ([place])    Per [facility]      Hyperlipidemia    Encourage heart healthy diet such as MIND or  DASH diet, increase exercise, avoid trans fats, simple carbohydrates and processed foods, consider a krill or fish or flaxseed oil cap daily.       Relevant Orders   Comprehensive metabolic panel   Lipid panel   CBC with Differential/Platelet   Hemoglobin A1c   Essential hypertension    Controlled  On no meds      Relevant Orders   Comprehensive metabolic panel   Lipid panel   CBC with Differential/Platelet   Hemoglobin A1c   Anxiety    stable      Relevant Medications   ALPRAZolam (XANAX) 0.25 MG tablet   Abdominal pain - Primary    Urine normal  No d/c , or vag itching Culture pending  Check labs       Relevant Orders   POCT Urinalysis Dipstick (Automated) (Completed)   Urine Culture     Meds ordered this encounter  Medications   ALPRAZolam (XANAX) 0.25 MG tablet    Sig: Take 1 tablet (0.25 mg total) by mouth 3 (three) times daily as needed.    Dispense:  30 tablet    Refill:  0    I, Ann Held, DO, personally preformed the services described in this  documentation.  All medical record entries made by the scribe were at my direction and in my presence.  I have reviewed the chart and discharge instructions (if applicable) and agree that the record reflects my personal performance and is accurate and complete. 11/08/2021   I,Shehryar Baig,acting as a scribe for Ann Held, DO.,have documented all relevant documentation on the behalf of Ann Held, DO,as directed by  Ann Held, DO while in the presence of Ann Held, DO.   Ann Held, DO

## 2021-11-08 NOTE — Assessment & Plan Note (Signed)
Controlled  On no meds

## 2021-11-08 NOTE — Assessment & Plan Note (Signed)
Urine normal  No d/c , or vag itching Culture pending  Check labs

## 2021-11-09 LAB — COMPREHENSIVE METABOLIC PANEL
ALT: 20 U/L (ref 0–35)
AST: 26 U/L (ref 0–37)
Albumin: 4.3 g/dL (ref 3.5–5.2)
Alkaline Phosphatase: 50 U/L (ref 39–117)
BUN: 17 mg/dL (ref 6–23)
CO2: 27 mEq/L (ref 19–32)
Calcium: 9.3 mg/dL (ref 8.4–10.5)
Chloride: 102 mEq/L (ref 96–112)
Creatinine, Ser: 1.19 mg/dL (ref 0.40–1.20)
GFR: 49.38 mL/min — ABNORMAL LOW (ref >60.00)
Glucose, Bld: 81 mg/dL (ref 70–99)
Potassium: 4.1 mEq/L (ref 3.5–5.1)
Sodium: 138 mEq/L (ref 135–145)
Total Bilirubin: 1.6 mg/dL — ABNORMAL HIGH (ref 0.2–1.2)
Total Protein: 7.7 g/dL (ref 6.0–8.3)

## 2021-11-09 LAB — CBC WITH DIFFERENTIAL/PLATELET
Basophils Absolute: 0 10*3/uL (ref 0.0–0.1)
Basophils Relative: 0.8 % (ref 0.0–3.0)
Eosinophils Absolute: 0.1 10*3/uL (ref 0.0–0.7)
Eosinophils Relative: 1.7 % (ref 0.0–5.0)
HCT: 40.9 % (ref 36.0–46.0)
Hemoglobin: 14 g/dL (ref 12.0–15.0)
Lymphocytes Relative: 19.5 % (ref 12.0–46.0)
Lymphs Abs: 0.8 10*3/uL (ref 0.7–4.0)
MCHC: 34.2 g/dL (ref 30.0–36.0)
MCV: 87.9 fl (ref 78.0–100.0)
Monocytes Absolute: 0.5 10*3/uL (ref 0.1–1.0)
Monocytes Relative: 12.2 % — ABNORMAL HIGH (ref 3.0–12.0)
Neutro Abs: 2.9 10*3/uL (ref 1.4–7.7)
Neutrophils Relative %: 65.8 % (ref 43.0–77.0)
Platelets: 329 10*3/uL (ref 150.0–400.0)
RBC: 4.65 Mil/uL (ref 3.87–5.11)
RDW: 15.6 % — ABNORMAL HIGH (ref 11.5–15.5)
WBC: 4.3 10*3/uL (ref 4.0–10.5)

## 2021-11-09 LAB — LIPID PANEL
Cholesterol: 271 mg/dL — ABNORMAL HIGH (ref 0–200)
HDL: 77.5 mg/dL (ref >39.00)
LDL Cholesterol: 160 mg/dL — ABNORMAL HIGH (ref 0–99)
NonHDL: 193.03
Total CHOL/HDL Ratio: 3
Triglycerides: 167 mg/dL — ABNORMAL HIGH (ref 0.0–149.0)
VLDL: 33.4 mg/dL (ref 0.0–40.0)

## 2021-11-09 LAB — HEMOGLOBIN A1C: Hgb A1c MFr Bld: 5.8 % (ref 4.6–6.5)

## 2021-11-10 ENCOUNTER — Encounter: Payer: Self-pay | Admitting: Family Medicine

## 2021-11-10 ENCOUNTER — Other Ambulatory Visit: Payer: Self-pay

## 2021-11-10 DIAGNOSIS — E785 Hyperlipidemia, unspecified: Secondary | ICD-10-CM

## 2021-11-10 DIAGNOSIS — I10 Essential (primary) hypertension: Secondary | ICD-10-CM

## 2021-11-10 LAB — URINE CULTURE
MICRO NUMBER:: 13781672
SPECIMEN QUALITY:: ADEQUATE

## 2021-11-21 ENCOUNTER — Telehealth (INDEPENDENT_AMBULATORY_CARE_PROVIDER_SITE_OTHER): Payer: BC Managed Care – PPO | Admitting: Family Medicine

## 2021-11-21 ENCOUNTER — Encounter: Payer: Self-pay | Admitting: Family Medicine

## 2021-11-21 DIAGNOSIS — U071 COVID-19: Secondary | ICD-10-CM

## 2021-11-21 MED ORDER — MOLNUPIRAVIR EUA 200MG CAPSULE: 4.0000 | ORAL_CAPSULE | Freq: Two times a day (BID) | ORAL | 0 refills | Status: AC

## 2021-11-21 NOTE — Progress Notes (Signed)
MyChart Video Visit    Virtual Visit via Video Note   This visit type was conducted due to national recommendations for restrictions regarding the COVID-19 Pandemic (e.g. social distancing) in an effort to limit this patient's exposure and mitigate transmission in our community. This patient is at least at moderate risk for complications without adequate follow up. This format is felt to be most appropriate for this patient at this time. Physical exam was limited by quality of the video and audio technology used for the visit. Nira Conn  was able to get the patient set up on a video visit.  Patient location: home Patient and provider in visit Provider location: Office  I discussed the limitations of evaluation and management by telemedicine and the availability of in person appointments. The patient expressed understanding and agreed to proceed.  Visit Date: 11/21/2021  Today's healthcare provider: Ann Held, DO     Subjective:    Patient ID: [name], female    DOB: 07-05-1960, 61 y.o.   MRN: [id number]  Chief Complaint  Patient presents with   Covid Positive    HPI Patient is in today for + covid.   She tested positive Sat --- symptoms started Friday with just not feeling good and tickle in her throat.   Sat her symptoms worsened ----fever 101 and bodyaches.   Pt is taking tylenol pm and ziacam, vita c .    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 Melvyn Novas   Blood transfusion without reported diagnosis    years ago (2003)   GERD (gastroesophageal reflux disease)    History of colonic polyps    Hyperglycemia    Hyperlipidemia    Villous adenoma 1997    Past Surgical History:  Procedure Laterality Date   ABDOMINAL HYSTERECTOMY     &USO for fibroid & servere anemia   APPENDECTOMY     COLONOSCOPY  2010   FETAL BLOOD TRANSFUSION  03/2001   pre tah    PARTIAL COLECTOMY  1997   for "precancerous "polyps , Dr Benjamine Sprague, [place]   TONSILLECTOMY      Family History  Problem Relation Age of Onset   Colon cancer Father 1   Leukemia Father    Hyperlipidemia Brother    Hypertension Brother    Colon cancer Maternal Aunt    Diabetes Paternal Uncle    Asthma Maternal Grandmother    Hypertension Mother    Colon polyps Neg Hx    Esophageal cancer Neg Hx    Rectal cancer Neg Hx    Stomach cancer Neg Hx     Social History   Socioeconomic History   Marital status: Single    Spouse name: Not on file   Number of children: Not on file   Years of education: Not on file   Highest education level: Not on file  Occupational History   Occupation: Product manager: [employer]  Tobacco Use   Smoking status: Former    Types: Cigarettes    Quit date: 03/27/2001    Years since quitting: 20.6   Smokeless tobacco: Never   Tobacco comments:    pt states only smoked socially and unsure how many years she smoked  Vaping Use   Vaping Use: Never used  Substance and Sexual Activity   Alcohol use:  No   Drug use: Never   Sexual activity: Not on file  Other Topics Concern   Not on file  Social History Narrative   Gets regular exercise         Social Determinants of Health   Financial Resource Strain: Not on file  Food Insecurity: Not on file  Transportation Needs: Not on file  Physical Activity: Not on file  Stress: Not on file  Social Connections: Not on file  Intimate Partner Violence: Not on file    Outpatient Medications Prior to Visit  Medication Sig Dispense Refill   ALPRAZolam (XANAX) 0.25 MG tablet Take 1 tablet (0.25 mg total) by mouth 3 (three) times daily as needed. 30 tablet 0   azaTHIOprine (IMURAN) 50 MG tablet Take 1 tablet by mouth daily.     cyclobenzaprine (FLEXERIL) 10 MG tablet Take 1 tablet by mouth three times daily as needed for muscle spasm 30 tablet 0   famotidine (PEPCID) 20 MG tablet Take 1 tablet  (20 mg total) by mouth 2 (two) times daily. 60 tablet 2   prednisoLONE acetate (PRED FORTE) 1 % ophthalmic suspension 1 drop 4 (four) times daily. 5 mL 0   No facility-administered medications prior to visit.    Allergies  Allergen Reactions   Codeine     REACTION: GI UPSET    Review of Systems  Constitutional:  Negative for fever and malaise/fatigue.  HENT:  Negative for congestion and sinus pain.   Eyes:  Negative for blurred vision.  Respiratory:  Negative for cough and shortness of breath.   Cardiovascular:  Negative for chest pain, palpitations and leg swelling.  Gastrointestinal:  Negative for vomiting.  Musculoskeletal:  Negative for back pain.  Skin:  Negative for rash.  Neurological:  Negative for loss of consciousness and headaches.       Objective:    Physical Exam Vitals and nursing note reviewed.  Constitutional:      Appearance: She is well-developed.  Eyes:     Conjunctiva/sclera: Conjunctivae normal.  Neck:     Thyroid: No thyromegaly.     Vascular: No carotid bruit or JVD.  Cardiovascular:     Rate and Rhythm: Regular rhythm.  Pulmonary:     Effort: Pulmonary effort is normal.  Musculoskeletal:     Cervical back: Neck supple.  Neurological:     Mental Status: She is alert and oriented to person, place, and time.  Psychiatric:        Mood and Affect: Mood normal.     LMP 03/27/2001  Wt Readings from Last 3 Encounters:  11/08/21 182 lb (82.6 kg)  01/28/21 178 lb 12.8 oz (81.1 kg)  10/25/20 177 lb 3.2 oz (80.4 kg)    Diabetic Foot Exam - Simple   No data filed    Lab Results  Component Value Date   WBC 4.3 11/08/2021   HGB 14.0 11/08/2021   HCT 40.9 11/08/2021   PLT 329.0 11/08/2021   GLUCOSE 81 11/08/2021   CHOL 271 (H) 11/08/2021   TRIG 167.0 (H) 11/08/2021   HDL 77.50 11/08/2021   LDLDIRECT 145.6 12/08/2008   LDLCALC 160 (H) 11/08/2021   ALT 20 11/08/2021   AST 26 11/08/2021   NA 138 11/08/2021   K 4.1 11/08/2021   CL 102  11/08/2021   CREATININE 1.19 11/08/2021   BUN 17 11/08/2021   CO2 27 11/08/2021   TSH 1.86 01/28/2021   HGBA1C 5.8 11/08/2021    Lab Results  Component Value  Date   TSH 1.86 01/28/2021   Lab Results  Component Value Date   WBC 4.3 11/08/2021   HGB 14.0 11/08/2021   HCT 40.9 11/08/2021   MCV 87.9 11/08/2021   PLT 329.0 11/08/2021   Lab Results  Component Value Date   NA 138 11/08/2021   K 4.1 11/08/2021   CO2 27 11/08/2021   GLUCOSE 81 11/08/2021   BUN 17 11/08/2021   CREATININE 1.19 11/08/2021   BILITOT 1.6 (H) 11/08/2021   ALKPHOS 50 11/08/2021   AST 26 11/08/2021   ALT 20 11/08/2021   PROT 7.7 11/08/2021   ALBUMIN 4.3 11/08/2021   CALCIUM 9.3 11/08/2021   GFR 49.38 (L) 11/08/2021   Lab Results  Component Value Date   CHOL 271 (H) 11/08/2021   Lab Results  Component Value Date   HDL 77.50 11/08/2021   Lab Results  Component Value Date   LDLCALC 160 (H) 11/08/2021   Lab Results  Component Value Date   TRIG 167.0 (H) 11/08/2021   Lab Results  Component Value Date   CHOLHDL 3 11/08/2021   Lab Results  Component Value Date   HGBA1C 5.8 11/08/2021       Assessment & Plan:   Problem List Items Addressed This Visit       Unprioritized   COVID-19 - Primary    Antiviral sent in Antihistamine daily  con't tylenol/ ibuprofen prn body aches and fevers       Relevant Medications   molnupiravir EUA (LAGEVRIO) 200 mg CAPS capsule    I am having [name] start on molnupiravir EUA. I am also having her maintain her azaTHIOprine, famotidine, cyclobenzaprine, ALPRAZolam, and prednisoLONE acetate.  Meds ordered this encounter  Medications   molnupiravir EUA (LAGEVRIO) 200 mg CAPS capsule    Sig: Take 4 capsules (800 mg total) by mouth 2 (two) times daily for 5 days.    Dispense:  40 capsule    Refill:  0    I discussed the assessment and treatment plan with the patient. The patient was provided an opportunity to ask questions and all were  answered. The patient agreed with the plan and demonstrated an understanding of the instructions.   The patient was advised to call back or seek an in-person evaluation if the symptoms worsen or if the condition fails to improve as anticipated.     Ann Held, DO [place] [facility] [phone] (phone) [phone] (fax)  [place]

## 2021-11-21 NOTE — Telephone Encounter (Signed)
Appt scheduled w/ PCP.  

## 2021-11-21 NOTE — Assessment & Plan Note (Signed)
Antiviral sent in Antihistamine daily  con't tylenol/ ibuprofen prn body aches and fevers

## 2021-12-28 ENCOUNTER — Other Ambulatory Visit: Payer: Self-pay | Admitting: Family Medicine

## 2021-12-28 DIAGNOSIS — R1013 Epigastric pain: Secondary | ICD-10-CM

## 2022-03-02 ENCOUNTER — Other Ambulatory Visit: Payer: Self-pay | Admitting: Family Medicine

## 2022-03-02 DIAGNOSIS — M5442 Lumbago with sciatica, left side: Secondary | ICD-10-CM

## 2022-03-03 MED ORDER — CYCLOBENZAPRINE HCL 10 MG PO TABS: 10.0000 mg | ORAL_TABLET | Freq: Three times a day (TID) | ORAL | 1 refills | Status: DC | PRN

## 2022-04-12 ENCOUNTER — Other Ambulatory Visit: Payer: Self-pay | Admitting: Family Medicine

## 2022-04-12 DIAGNOSIS — Z1231 Encounter for screening mammogram for malignant neoplasm of breast: Secondary | ICD-10-CM

## 2022-04-18 ENCOUNTER — Ambulatory Visit (INDEPENDENT_AMBULATORY_CARE_PROVIDER_SITE_OTHER): Payer: BC Managed Care – PPO

## 2022-04-18 ENCOUNTER — Ambulatory Visit: Payer: BC Managed Care – PPO | Admitting: Family Medicine

## 2022-04-18 VITALS — BP 154/92 | HR 69 | Ht 64.0 in | Wt 184.0 lb

## 2022-04-18 DIAGNOSIS — M546 Pain in thoracic spine: Secondary | ICD-10-CM

## 2022-04-18 DIAGNOSIS — G8929 Other chronic pain: Secondary | ICD-10-CM

## 2022-04-18 MED ORDER — TIZANIDINE HCL 2 MG PO TABS: 2.0000 mg | ORAL_TABLET | Freq: Three times a day (TID) | ORAL | 2 refills | Status: DC | PRN

## 2022-04-18 NOTE — Progress Notes (Signed)
   I, Peterson Lombard, LAT, ATC acting as a scribe for Lynne Leader, MD.  Subjective:    CC: Thoracic back pain  HPI: Pt is a 62 y/o female c/o thoracic back pain. Pt was seen previously by Dr. Tamala Julian in 2018 for OMT. Today, pt reports mid-back pain ongoing for about 1 week. Pt locates pain to both sides of her thoracic spine, along the bra strap.  Radiating pain: yes- into R arm UE numbness/tingling: no UE weakness:no Aggravates: ADLs Treatments tried: ice, heat, tylenol, naproxen  Dx imaging: 07/17/20 L-spine MRI  07/07/20 L-spine XR  Pertinent review of Systems: No fevers or chills  Relevant historical information: History of sarcoidosis.  History of lumbar radiculopathy. Lives in [place] and works in [place].  Objective:    Vitals:   04/18/22 1339 04/18/22 1344  BP: (!) 154/92 (!) 154/92  Pulse: 69   SpO2: 98%    General: Well Developed, well nourished, and in no acute distress.   MSK: T-spine: Normal appearing Nontender palpation spinal midline.  Tender palpation paraspinal musculature. Normal thoracic motion.  Lab and Radiology Results  X-ray images T-spine obtained today personally and independently interpreted No acute vertebral compression fracture.  Minimal degenerative changes. Await formal radiology review    Impression and Recommendations:    Assessment and Plan: 62 y.o. female with acute thoracic back pain.  Pain is focused around the rhomboid region.  Pain thought to be due to muscle spasm and dysfunction.  Plan for trial of physical therapy heating pad TENS unit and tizanidine.  Based on where she lives and works a good starting point for physical therapy would be the [facility] med center in [place]  PDMP not reviewed this encounter. Orders Placed This Encounter  Procedures   DG Thoracic Spine 2 View    Standing Status:   Future    Number of Occurrences:   1    Standing Expiration Date:   04/19/2023    Order Specific Question:   Reason  for Exam (SYMPTOM  OR DIAGNOSIS REQUIRED)    Answer:   eval pain tspine    Order Specific Question:   Preferred imaging location?    Answer:   [facility]   Ambulatory referral to Physical Therapy    Referral Priority:   Routine    Referral Type:   Physical Medicine    Referral Reason:   Specialty Services Required    Requested Specialty:   Physical Therapy    Number of Visits Requested:   1   Meds ordered this encounter  Medications   tiZANidine (ZANAFLEX) 2 MG tablet    Sig: Take 1-2 tablets (2-4 mg total) by mouth every 8 (eight) hours as needed for muscle spasms.    Dispense:  30 tablet    Refill:  2    Discussed warning signs or symptoms. Please see discharge instructions. Patient expresses understanding.   The above documentation has been reviewed and is accurate and complete Lynne Leader, M.D.

## 2022-04-18 NOTE — Patient Instructions (Signed)
Thank you for coming in today.   Please get an Xray today before you leave   I've referred you to Physical Therapy.  Let us know if you don't hear from them in one week.   Try a heating pad \  TENS UNIT: This is helpful for muscle pain and spasm.   Search and Purchase a TENS 7000 2nd edition at  www.[email or web address] or [email or web address] It should be less than $30.     TENS unit instructions: Do not shower or bathe with the unit on Turn the unit off before removing electrodes or batteries If the electrodes lose stickiness add a drop of water to the electrodes after they are disconnected from the unit and place on plastic sheet. If you continued to have difficulty, call the TENS unit company to purchase more electrodes. Do not apply lotion on the skin area prior to use. Make sure the skin is clean and dry as this will help prolong the life of the electrodes. After use, always check skin for unusual red areas, rash or other skin difficulties. If there are any skin problems, does not apply electrodes to the same area. Never remove the electrodes from the unit by pulling the wires. Do not use the TENS unit or electrodes other than as directed. Do not change electrode placement without consultating your therapist or physician. Keep 2 fingers with between each electrode. Wear time ratio is 2:1, on to off times.    For example on for 30 minutes off for 15 minutes and then on for 30 minutes off for 15 minutes

## 2022-04-19 ENCOUNTER — Encounter: Payer: Self-pay | Admitting: Family Medicine

## 2022-04-19 NOTE — Telephone Encounter (Signed)
Call pt and spoke to her in more detail about her XR results. Re-advised Dr. Clovis Riley plan and encouraged her to call PT to schedule.

## 2022-04-19 NOTE — Progress Notes (Signed)
Thoracic spine x-ray shows no fractures.  There is a little neck arthritis that they can at the bottom of the cervical spine.

## 2022-04-26 ENCOUNTER — Ambulatory Visit (INDEPENDENT_AMBULATORY_CARE_PROVIDER_SITE_OTHER): Payer: BC Managed Care – PPO

## 2022-04-26 DIAGNOSIS — Z1231 Encounter for screening mammogram for malignant neoplasm of breast: Secondary | ICD-10-CM

## 2022-05-03 ENCOUNTER — Encounter: Payer: Self-pay | Admitting: Physical Therapy

## 2022-05-03 ENCOUNTER — Ambulatory Visit: Payer: BC Managed Care – PPO | Admitting: Physical Therapy

## 2022-05-03 ENCOUNTER — Other Ambulatory Visit: Payer: Self-pay

## 2022-05-03 DIAGNOSIS — G8929 Other chronic pain: Secondary | ICD-10-CM

## 2022-05-03 DIAGNOSIS — M546 Pain in thoracic spine: Secondary | ICD-10-CM | POA: Diagnosis not present

## 2022-05-03 DIAGNOSIS — M6281 Muscle weakness (generalized): Secondary | ICD-10-CM | POA: Diagnosis not present

## 2022-05-03 DIAGNOSIS — M25511 Pain in right shoulder: Secondary | ICD-10-CM | POA: Diagnosis not present

## 2022-05-03 NOTE — Therapy (Addendum)
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 Melvyn Novas   Blood transfusion without reported diagnosis    years ago (2003)   GERD (gastroesophageal reflux disease)    History of colonic polyps    Hyperglycemia    Hyperlipidemia    Villous adenoma 1997   Past Surgical History:  Procedure Laterality Date   ABDOMINAL HYSTERECTOMY     &USO for fibroid & servere anemia   APPENDECTOMY     COLONOSCOPY  2010   FETAL BLOOD TRANSFUSION  03/2001   pre tah   PARTIAL COLECTOMY  1997   for "precancerous "polyps , Dr Benjamine Sprague, [place]     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[place]) 11/04/2020   Sjogren's syndrome with keratoconjunctivitis sicca ([place]) 10/25/2020   Screening for tuberculosis 10/25/2020   Essential hypertension 10/09/2019   Other fatigue 10/09/2019   Elevated BP without diagnosis of hypertension 09/18/2019   Achilles tendon pain 01/29/2018   Left Achilles tendinitis 12/08/2015   Skin lesions 05/25/2015   Costochondritis 04/02/2015   Muscle spasm of back 07/31/2014   Chronically dry eyes  11/06/2013   Allergic dermatitis 11/06/2013   Contact dermatitis 09/16/2013   Neck pain on right side 09/02/2013   Nonallopathic lesion of cervical region 09/02/2013   Nonallopathic lesion of thoracic region 09/02/2013   Nonallopathic lesion of lumbosacral region 09/02/2013   Rotator cuff injury 02/07/2013   Plantar fasciitis 07/31/2012   Overweight (BMI 25.0-29.9) 07/31/2012   Allergic conjunctivitis 12/25/2011   Dysphagia 12/25/2011   Acute hip pain 07/31/2011   Low back pain radiating to right leg 02/06/2011   TMJ PAIN 03/29/2010   HAIR LOSS 03/29/2010   ADENOPATHY LYMPH GLAND/TRACHEOBR 03/04/2009   Nonspecific (abnormal) findings on radiological and other examination of body structure 01/22/2009   Abnormal CT of the chest 01/22/2009   ARTHRALGIA 01/21/2009   Atypical chest pain 01/21/2009   ANHEDONIA 11/17/2008   FATIGUE 11/17/2008   BENIGN NEOPLASM OF SKIN SITE UNSPECIFIED 03/19/2008   CONTACT DERMATITIS DUE TO METALS 02/25/2008   Hyperlipidemia 12/13/2007   ANEMIA-NOS 12/13/2007   COLONIC POLYPS, HX OF 12/13/2007   DISTURBANCE OF SKIN SENSATION 05/28/2007    PCP: Ann Held, DO  REFERRING PROVIDER: Gregor Hams, MD  REFERRING DIAG: [phone] (ICD-10-CM) - Chronic bilateral thoracic back pain  THERAPY DIAG:  Pain in thoracic spine  Muscle weakness (generalized)  Chronic right shoulder  pain  Rationale for Evaluation and Treatment: Rehabilitation  ONSET DATE: around 3 weeks   SUBJECTIVE:                                                                                                                                                                                                         SUBJECTIVE STATEMENT: Patient presents today with bilateral thoracic spine pain. She reports the pain started around three weeks ago insidious onset, she can't remember anything specific bringing the pain on. Since seeing the doctor around two weeks ago she said the  pain has gotten a little better, but it is still there. She denies any N/T however her pain does sometimes radiate to shoulder region which she described as an ache when it does.     PERTINENT HISTORY:  History of sarcoidosis.  History of lumbar radiculopathy.  PAIN:  Are you having pain? Yes: NPRS scale: currently 5/10, was up to 10/10 Pain location: thoracic bilat in rhomboid region, periscaps, sometimes shoulder region Pain description: burning, "piercing", sharp, ache in shoulder region Aggravating factors: pulling out laundry, reaching, overhead activity  Relieving factors: pain medication (muscle relaxors, tylenol, alieve) , heat soothes, ice numbs  PRECAUTIONS: None  WEIGHT BEARING RESTRICTIONS: No  FALLS:  Has patient fallen in last 6 months? No  OCCUPATION: education   PLOF: Independent  PATIENT GOALS: get some relief and lossen it up   NEXT MD VISIT: nothing scheduled   OBJECTIVE:   DIAGNOSTIC FINDINGS:  IMPRESSION: No acute fracture or dislocation. Mild degenerative joint changes of the cervical spine.  PATIENT SURVEYS:  Eval: FOTO 53% functional, goal is 69%  COGNITION: Overall cognitive status: Within functional limits for tasks assessed  SENSATION: WFL  POSTURE:  mild kyphosis  PALPATION: TTP C7-T8, bilat paraspinals Tightness in bilat paraspinals, periscaps, and upper traps Trigger point left mid thoracic region around T6, and at infraspinatus bilat   CERVICAL ROM:   Active ROM AROM (deg) eval  Flexion WNL  Extension WNL  Right lateral flexion 75%  Left lateral flexion 75%  Right rotation WNL  Left rotation WNL   (Blank rows = not tested)  THORACIC ROM:   Active ROM AROM (deg) eval  Flexion 75%  Extension 75%  Right lateral flexion   Left lateral flexion   Right rotation 75%  Left rotation      75%    UPPER EXTREMITY ROM:  Active ROM Right eval Left eval  Shoulder flexion WNL WNL  Shoulder extension    Shoulder abduction  WNL WNL  Shoulder adduction  Shoulder extension    Shoulder internal rotation T6 reaching behing back T3 Reaching behind back  Shoulder external rotation T3 reaching behind head and pain T2 reaching behind head and pain  Elbow flexion    Elbow extension    Wrist flexion    Wrist extension    Wrist ulnar deviation    Wrist radial deviation    Wrist pronation    Wrist supination     (Blank rows = not tested)  UPPER EXTREMITY MMT:  MMT Right eval Left eval  Shoulder flexion 4+ 4+  Shoulder extension    Shoulder abduction 4+ 4+  Rhomboids 4 4  Shoulder extension 4+ 4+  Shoulder internal rotation 4 4  Shoulder external rotation 4- pain 4- pain  Middle trapezius 4 4  Lower trapezius 3 3  Elbow flexion    Elbow extension    Wrist flexion    Wrist extension    Wrist ulnar deviation    Wrist radial deviation    Wrist pronation    Wrist supination    Grip strength     (Blank rows = not tested)  TODAY'S TREATMENT:  Eval HEP creation and review with demonstration and trial set preformed, see below for details TENS in IFC formation on bilateral mid and upper back x12 min turned up to tolerance (~7), moist heat pack applied on top     PATIENT EDUCATION: Education details: HEP, PT plan of care Person educated: Patient Education method: Explanation, Demonstration, Verbal cues, and Handouts Education comprehension: verbalized understanding and needs further education   HOME EXERCISE PROGRAM: Access Code: [email or web address] URL: [email or web address] Date: 05/03/2022 Prepared by: Elsie Ra  Exercises - Cat Cow to Child's Pose  - 2 x daily - 6 x weekly - 1 sets - 10 reps - 5 sec hold - Sidelying Thoracic Lumbar Rotation  - 2 x daily - 6 x weekly - 1 sets - 10 reps - 5 hold - Seated Upper Trapezius Stretch  - 2 x daily - 6 x weekly - 1 sets - 3 reps - 30 sec hold - Prone Single Arm Shoulder Y  - 2 x daily - 6 x weekly - 1-2 sets - 10 reps - Standing Shoulder  Extension with Resistance  - 2 x daily - 6 x weekly - 1-2 sets - 10 reps - Standing Shoulder Row with Anchored Resistance  - 2 x daily - 6 x weekly - 1-2 sets - 10 reps - Standing Shoulder Horizontal Abduction with Resistance  - 2 x daily - 6 x weekly - 1-2 sets - 10 reps - Standing Shoulder External Rotation with Resistance  - 2 x daily - 6 x weekly - 1-2 sets - 10 reps  ASSESSMENT:  CLINICAL IMPRESSION: Patient referred to PT for chronic bilateral thoracic back pain. We trialed TENS machine with moist heat, and advised if she feels this helped that she can look into purchasing an at-home unit, with information previously provided by MD. Patient will benefit from skilled PT to address impairments in ROM of thoracic spine and general weakness of UE to improve overall function and decrease.   OBJECTIVE IMPAIRMENTS: decreased activity tolerance, decreased shoulder mobility, decreased ROM, decreased strength, impaired flexibility, impaired UE use, postural dysfunction, and pain.  ACTIVITY LIMITATIONS: reaching, lifting, carry,  cleaning, driving, and or occupation  PERSONAL FACTORS:  History of sarcoidosis and history of lumbar radiculopathy also affecting patient's functional outcome.  REHAB POTENTIAL: Good  CLINICAL DECISION MAKING: Stable/uncomplicated  EVALUATION COMPLEXITY: Low  GOALS: Short term PT Goals Target date: 05/31/2022   Pt will be I and compliant with HEP. Baseline:  Goal status: New Pt will decrease pain by 25% overall Baseline: Goal status: New  Long term PT goals Target date: 06/14/2022   Pt will improve cervical-thoracic AROM to WNL to improve function Baseline: Goal status: New Pt will improve  bilat shoulder strength to at least 4+/5 MMT to improve functional strength Baseline: Goal status: New Pt will improve FOTO to at least 69% functional to show improved function Baseline: Goal status: New Pt will reduce pain to overall less than 2/10 with usual  activity and work activity. Baseline: Goal status: New  PLAN: PT FREQUENCY: 1-2 times per week   PT DURATION: 4-6 weeks  PLANNED INTERVENTIONS (unless contraindicated): aquatic PT, Canalith repositioning, cryotherapy, Electrical stimulation, Iontophoresis with 4 mg/ml dexamethasome, Moist heat, traction, Ultrasound, gait training, Therapeutic exercise, balance training, neuromuscular re-education, patient/family education, prosthetic training, manual techniques, passive ROM, dry needling, taping, vasopnuematic device, vestibular, spinal manipulations, joint manipulations  PLAN FOR NEXT SESSION: review HEP, how was TENS? Consider dry needling, needs UE strengthening    Garhett Bernhard, SPT  PHYSICAL THERAPY DISCHARGE SUMMARY  Visits from Start of Care: 1  Current functional level related to goals / functional outcomes: See above   Remaining deficits: See above   Education / Equipment: HEP  Plan:  Patient goals were not met. Patient is being discharged due to not returning since last visit >30 days   Ivery Quale, PT, DPT 07/10/22 8:43 AM

## 2022-05-04 ENCOUNTER — Encounter: Payer: Self-pay | Admitting: Physical Therapy

## 2022-05-10 ENCOUNTER — Telehealth: Payer: Self-pay

## 2022-05-10 NOTE — Telephone Encounter (Signed)
Caller Name [place] Phone Number [phone] Patient Name [name] Patient DOB April 16, 1960 Call Type Message Only Information Provided Reason for Call Request for General Office Information Initial Comment Caller states a appt was made for her in error. Additional Comment Please call pt back when office re-opens to inform her of the appt that is scheduled for Monday. Office hours provided. Disp. Time Disposition Final User 05/10/2022 12:42:05 PM General Information Provided Yes Phoebe Perch Call Closed By: Phoebe Perch Transaction Date/Time: 05/10/2022 12:39:22 PM (ET)

## 2022-05-10 NOTE — Telephone Encounter (Signed)
Appt changed to Tuesday.

## 2022-05-15 ENCOUNTER — Ambulatory Visit (INDEPENDENT_AMBULATORY_CARE_PROVIDER_SITE_OTHER): Payer: BC Managed Care – PPO | Admitting: Physical Therapy

## 2022-05-15 ENCOUNTER — Encounter: Payer: Self-pay | Admitting: Physical Therapy

## 2022-05-15 ENCOUNTER — Other Ambulatory Visit: Payer: BC Managed Care – PPO

## 2022-05-15 DIAGNOSIS — M25511 Pain in right shoulder: Secondary | ICD-10-CM | POA: Diagnosis not present

## 2022-05-15 DIAGNOSIS — G8929 Other chronic pain: Secondary | ICD-10-CM

## 2022-05-15 DIAGNOSIS — M546 Pain in thoracic spine: Secondary | ICD-10-CM | POA: Diagnosis not present

## 2022-05-15 DIAGNOSIS — M6281 Muscle weakness (generalized): Secondary | ICD-10-CM

## 2022-05-15 NOTE — Therapy (Addendum)
OUTPATIENT PHYSICAL THERAPY THORACIC  Treatment Note   Patient Name: [name] MRN: [id number] DOB:1961-03-10, 62 y.o., female Today's Date: 05/15/2022  END OF SESSION:  PT End of Session - 05/15/22 0909     Visit Number 2    Number of Visits 12    Date for PT Re-Evaluation 06/14/22    Authorization Type BCBS    PT Start Time 0850    PT Stop Time 0930    PT Time Calculation (min) 40 min    Activity Tolerance Patient tolerated treatment well    Behavior During Therapy WFL for tasks assessed/performed              Past Medical History:  Diagnosis Date   Allergy    ALSO DRY EYES   Anemia    NOS   Bilateral hilar adenopathy syndrome 01/21/2009   Dr. Melvyn Novas   Blood transfusion without reported diagnosis    years ago (2003)   GERD (gastroesophageal reflux disease)    History of colonic polyps    Hyperglycemia    Hyperlipidemia    Villous adenoma 1997   Past Surgical History:  Procedure Laterality Date   ABDOMINAL HYSTERECTOMY     &USO for fibroid & servere anemia   APPENDECTOMY     COLONOSCOPY  2010   FETAL BLOOD TRANSFUSION  03/2001   pre tah   PARTIAL COLECTOMY  1997   for "precancerous "polyps , Dr Benjamine Sprague, [place]     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[place]) 11/04/2020   Sjogren's syndrome with keratoconjunctivitis sicca ([place]) 10/25/2020   Screening for tuberculosis 10/25/2020   Essential hypertension 10/09/2019   Other fatigue 10/09/2019   Elevated BP without diagnosis of hypertension 09/18/2019   Achilles tendon pain 01/29/2018   Left Achilles tendinitis 12/08/2015   Skin lesions 05/25/2015   Costochondritis 04/02/2015   Muscle spasm of back 07/31/2014   Chronically  dry eyes 11/06/2013   Allergic dermatitis 11/06/2013   Contact dermatitis 09/16/2013   Neck pain on right side 09/02/2013   Nonallopathic lesion of cervical region 09/02/2013   Nonallopathic lesion of thoracic region 09/02/2013   Nonallopathic lesion of lumbosacral region 09/02/2013   Rotator cuff injury 02/07/2013   Plantar fasciitis 07/31/2012   Overweight (BMI 25.0-29.9) 07/31/2012   Allergic conjunctivitis 12/25/2011   Dysphagia 12/25/2011   Acute hip pain 07/31/2011   Low back pain radiating to right leg 02/06/2011   TMJ PAIN 03/29/2010   HAIR LOSS 03/29/2010   ADENOPATHY LYMPH GLAND/TRACHEOBR 03/04/2009   Nonspecific (abnormal) findings on radiological and other examination of body structure 01/22/2009   Abnormal CT of the chest 01/22/2009   ARTHRALGIA 01/21/2009   Atypical chest pain 01/21/2009   ANHEDONIA 11/17/2008   FATIGUE 11/17/2008   BENIGN NEOPLASM OF SKIN SITE UNSPECIFIED 03/19/2008   CONTACT DERMATITIS DUE TO METALS 02/25/2008   Hyperlipidemia 12/13/2007   ANEMIA-NOS 12/13/2007   COLONIC POLYPS, HX OF 12/13/2007   DISTURBANCE OF SKIN SENSATION 05/28/2007    PCP: Ann Held, DO  REFERRING PROVIDER: Gregor Hams, MD  REFERRING DIAG: [phone] (ICD-10-CM) - Chronic bilateral thoracic back pain  THERAPY DIAG:  Pain in thoracic spine  Muscle weakness (generalized)  Chronic right shoulder pain  Rationale for Evaluation and Treatment: Rehabilitation  ONSET DATE: around 3 weeks   SUBJECTIVE:                                                                                                                                                                                                         SUBJECTIVE STATEMENT: Pt arriving today reporting 2-3/10 pain in her upper thoracic spine. Pt stating her HEP has seemed to help.     PERTINENT HISTORY:  History of sarcoidosis.  History of lumbar radiculopathy.  PAIN:  Are you having pain? Yes: NPRS  scale: currently 2-3/10 Pain location: thoracic bilat in rhomboid region, periscaps, sometimes shoulder region Pain description: burning, "piercing", sharp, ache in shoulder region Aggravating factors: pulling out laundry, reaching, overhead activity  Relieving factors: pain medication (muscle relaxors, tylenol, alieve) , heat soothes, ice numbs  PRECAUTIONS: None  WEIGHT BEARING RESTRICTIONS: No  FALLS:  Has patient fallen in last 6 months? No  OCCUPATION: education   PLOF: Independent  PATIENT GOALS: get some relief and lossen it up   NEXT MD VISIT: nothing scheduled   OBJECTIVE:   DIAGNOSTIC FINDINGS:  IMPRESSION: No acute fracture or dislocation. Mild degenerative joint changes of the cervical spine.  PATIENT SURVEYS:  Eval: FOTO 53% functional, goal is 69%  COGNITION: Overall cognitive status: Within functional limits for tasks assessed  SENSATION: WFL  POSTURE:  mild kyphosis  PALPATION: TTP C7-T8, bilat paraspinals Tightness in bilat paraspinals, periscaps, and upper traps Trigger point left mid thoracic region around T6, and at infraspinatus bilat   CERVICAL ROM:   Active ROM AROM (deg) eval  Flexion WNL  Extension WNL  Right lateral flexion 75%  Left lateral flexion 75%  Right rotation WNL  Left rotation WNL   (Blank rows = not tested)  THORACIC ROM:   Active ROM AROM (deg) eval  Flexion 75%  Extension 75%  Right lateral flexion   Left lateral flexion   Right rotation 75%  Left rotation      75%    UPPER EXTREMITY ROM:  Active ROM Right eval Left eval  Shoulder flexion WNL WNL  Shoulder extension    Shoulder abduction WNL WNL  Shoulder adduction    Shoulder extension    Shoulder internal rotation T6 reaching behing back T3 Reaching behind back  Shoulder external rotation T3 reaching behind head and pain T2 reaching behind head and pain  Elbow flexion    Elbow extension    Wrist flexion    Wrist extension  Wrist ulnar  deviation    Wrist radial deviation    Wrist pronation    Wrist supination     (Blank rows = not tested)  UPPER EXTREMITY MMT:  MMT Right eval Left eval  Shoulder flexion 4+ 4+  Shoulder extension    Shoulder abduction 4+ 4+  Rhomboids 4 4  Shoulder extension 4+ 4+  Shoulder internal rotation 4 4  Shoulder external rotation 4- pain 4- pain  Middle trapezius 4 4  Lower trapezius 3 3  Elbow flexion    Elbow extension    Wrist flexion    Wrist extension    Wrist ulnar deviation    Wrist radial deviation    Wrist pronation    Wrist supination    Grip strength     (Blank rows = not tested)  TODAY'S TREATMENT:  05/15/22:  TherEx:  Standing shoulder flexion with back against red physio-ball on the wall 2 x 10 Standing AAROM shoulder flexion using 1 # bar 2 x 10 Supine thoracic extension over 1/2 bolster both vertical and horizontal each x 2 minutes Rows: level 4 band 2 x 15  Door stretch: x 3 holding 20 seconds shoulders at 90 degrees UBE: level 1 2 minutes each direction Upper trap stretch: x 2 each side holding 10 seconds Cat camel x 10 holding 5 seconds Door stretch: 90 shoulder flexion Rhomboid door stretch x 2 holding 10 seconds  Modalities:  E-stim: IFC to thoracic paraspinals c moist heat   Eval HEP creation and review with demonstration and trial set preformed, see below for details TENS in IFC formation on bilateral mid and upper back x12 min turned up to tolerance (~7), moist heat pack applied on top     PATIENT EDUCATION: Education details: HEP, PT plan of care Person educated: Patient Education method: Explanation, Demonstration, Verbal cues, and Handouts Education comprehension: verbalized understanding and needs further education   HOME EXERCISE PROGRAM: Access Code: [email or web address] URL: [email or web address] Date: 05/03/2022 Prepared by: Elsie Ra  Exercises - Cat Cow to Child's Pose  - 2 x daily - 6 x weekly - 1 sets - 10 reps -  5 sec hold - Sidelying Thoracic Lumbar Rotation  - 2 x daily - 6 x weekly - 1 sets - 10 reps - 5 hold - Seated Upper Trapezius Stretch  - 2 x daily - 6 x weekly - 1 sets - 3 reps - 30 sec hold - Prone Single Arm Shoulder Y  - 2 x daily - 6 x weekly - 1-2 sets - 10 reps - Standing Shoulder Extension with Resistance  - 2 x daily - 6 x weekly - 1-2 sets - 10 reps - Standing Shoulder Row with Anchored Resistance  - 2 x daily - 6 x weekly - 1-2 sets - 10 reps - Standing Shoulder Horizontal Abduction with Resistance  - 2 x daily - 6 x weekly - 1-2 sets - 10 reps - Standing Shoulder External Rotation with Resistance  - 2 x daily - 6 x weekly - 1-2 sets - 10 reps  ASSESSMENT:  CLINICAL IMPRESSION: Pt arriving today reporting mid thoracic pain of 2-3/10. Pt stating her pain has improved since coming to her last visit. Pt reporting compliance in her HEP and was able to demonstrate. Pt with good response to TENS treatment. Pt was edu in how to purchase one for home use. Continue skilled PT to maximize pt's function.   OBJECTIVE IMPAIRMENTS: decreased activity tolerance, decreased shoulder mobility, decreased ROM,  decreased strength, impaired flexibility, impaired UE use, postural dysfunction, and pain.  ACTIVITY LIMITATIONS: reaching, lifting, carry,  cleaning, driving, and or occupation  PERSONAL FACTORS:  History of sarcoidosis and history of lumbar radiculopathy also affecting patient's functional outcome.  REHAB POTENTIAL: Good  CLINICAL DECISION MAKING: Stable/uncomplicated  EVALUATION COMPLEXITY: Low    GOALS: Short term PT Goals Target date: 05/31/2022   Pt will be I and compliant with HEP. Baseline:  Goal status: on-going 05/15/22 Pt will decrease pain by 25% overall Baseline: Goal status: on-going 05/15/22  Long term PT goals Target date: 06/14/2022   Pt will improve cervical-thoracic AROM to WNL to improve function Baseline: Goal status: New Pt will improve  bilat shoulder  strength to at least 4+/5 MMT to improve functional strength Baseline: Goal status: New Pt will improve FOTO to at least 69% functional to show improved function Baseline: Goal status: New Pt will reduce pain to overall less than 2/10 with usual activity and work activity. Baseline: Goal status: New  PLAN: PT FREQUENCY: 1-2 times per week   PT DURATION: 4-6 weeks  PLANNED INTERVENTIONS (unless contraindicated): aquatic PT, Canalith repositioning, cryotherapy, Electrical stimulation, Iontophoresis with 4 mg/ml dexamethasome, Moist heat, traction, Ultrasound, gait training, Therapeutic exercise, balance training, neuromuscular re-education, patient/family education, prosthetic training, manual techniques, passive ROM, dry needling, taping, vasopnuematic device, vestibular, spinal manipulations, joint manipulations  PLAN FOR NEXT SESSION: STM at next visit per pt request, Consider dry needling as needed, UE and scapular strengthening. Thoracic stretching   Kearney Hard, PT, MPT 05/15/22 9:10 AM

## 2022-05-16 ENCOUNTER — Other Ambulatory Visit (INDEPENDENT_AMBULATORY_CARE_PROVIDER_SITE_OTHER): Payer: BC Managed Care – PPO

## 2022-05-16 DIAGNOSIS — I10 Essential (primary) hypertension: Secondary | ICD-10-CM

## 2022-05-16 DIAGNOSIS — E785 Hyperlipidemia, unspecified: Secondary | ICD-10-CM | POA: Diagnosis not present

## 2022-05-16 LAB — LIPID PANEL
Cholesterol: 275 mg/dL — ABNORMAL HIGH (ref 0–200)
HDL: 80.3 mg/dL (ref >39.00)
LDL Cholesterol: 178 mg/dL — ABNORMAL HIGH (ref 0–99)
NonHDL: 195.1
Total CHOL/HDL Ratio: 3
Triglycerides: 84 mg/dL (ref 0.0–149.0)
VLDL: 16.8 mg/dL (ref 0.0–40.0)

## 2022-05-16 LAB — COMPREHENSIVE METABOLIC PANEL
ALT: 14 U/L (ref 0–35)
AST: 18 U/L (ref 0–37)
Albumin: 4.2 g/dL (ref 3.5–5.2)
Alkaline Phosphatase: 56 U/L (ref 39–117)
BUN: 14 mg/dL (ref 6–23)
CO2: 30 mEq/L (ref 19–32)
Calcium: 9.6 mg/dL (ref 8.4–10.5)
Chloride: 103 mEq/L (ref 96–112)
Creatinine, Ser: 1.18 mg/dL (ref 0.40–1.20)
GFR: 49.7 mL/min — ABNORMAL LOW (ref >60.00)
Glucose, Bld: 84 mg/dL (ref 70–99)
Potassium: 4.2 mEq/L (ref 3.5–5.1)
Sodium: 140 mEq/L (ref 135–145)
Total Bilirubin: 1.2 mg/dL (ref 0.2–1.2)
Total Protein: 7.4 g/dL (ref 6.0–8.3)

## 2022-05-16 LAB — CBC WITH DIFFERENTIAL/PLATELET
Basophils Absolute: 0 10*3/uL (ref 0.0–0.1)
Basophils Relative: 0.7 % (ref 0.0–3.0)
Eosinophils Absolute: 0.1 10*3/uL (ref 0.0–0.7)
Eosinophils Relative: 1.4 % (ref 0.0–5.0)
HCT: 40.9 % (ref 36.0–46.0)
Hemoglobin: 14.2 g/dL (ref 12.0–15.0)
Lymphocytes Relative: 17.8 % (ref 12.0–46.0)
Lymphs Abs: 0.8 10*3/uL (ref 0.7–4.0)
MCHC: 34.8 g/dL (ref 30.0–36.0)
MCV: 88.7 fl (ref 78.0–100.0)
Monocytes Absolute: 0.5 10*3/uL (ref 0.1–1.0)
Monocytes Relative: 12.2 % — ABNORMAL HIGH (ref 3.0–12.0)
Neutro Abs: 3 10*3/uL (ref 1.4–7.7)
Neutrophils Relative %: 67.9 % (ref 43.0–77.0)
Platelets: 311 10*3/uL (ref 150.0–400.0)
RBC: 4.61 Mil/uL (ref 3.87–5.11)
RDW: 14.4 % (ref 11.5–15.5)
WBC: 4.4 10*3/uL (ref 4.0–10.5)

## 2022-05-17 ENCOUNTER — Other Ambulatory Visit: Payer: Self-pay | Admitting: Family Medicine

## 2022-05-17 DIAGNOSIS — E785 Hyperlipidemia, unspecified: Secondary | ICD-10-CM

## 2022-05-17 MED ORDER — ROSUVASTATIN CALCIUM 10 MG PO TABS: 10.0000 mg | ORAL_TABLET | Freq: Every day | ORAL | 2 refills | Status: DC

## 2022-05-17 NOTE — Addendum Note (Signed)
Addended by: Jiles Prows on: 05/17/2022 03:18 PM   Modules accepted: Orders

## 2022-05-18 ENCOUNTER — Encounter: Payer: Self-pay | Admitting: Family Medicine

## 2022-05-22 ENCOUNTER — Encounter: Payer: BC Managed Care – PPO | Admitting: Physical Therapy

## 2022-05-22 ENCOUNTER — Encounter: Payer: Self-pay | Admitting: Family Medicine

## 2022-05-28 ENCOUNTER — Encounter: Payer: Self-pay | Admitting: Family Medicine

## 2022-05-29 ENCOUNTER — Other Ambulatory Visit: Payer: Self-pay | Admitting: Family Medicine

## 2022-05-29 DIAGNOSIS — J329 Chronic sinusitis, unspecified: Secondary | ICD-10-CM

## 2022-07-04 ENCOUNTER — Other Ambulatory Visit: Payer: Self-pay | Admitting: Family Medicine

## 2022-07-04 DIAGNOSIS — R1013 Epigastric pain: Secondary | ICD-10-CM

## 2022-09-28 ENCOUNTER — Other Ambulatory Visit: Payer: Self-pay | Admitting: Family Medicine

## 2022-09-28 DIAGNOSIS — R1013 Epigastric pain: Secondary | ICD-10-CM

## 2022-12-04 ENCOUNTER — Other Ambulatory Visit: Payer: Self-pay | Admitting: Family Medicine

## 2022-12-04 DIAGNOSIS — R1013 Epigastric pain: Secondary | ICD-10-CM

## 2022-12-07 ENCOUNTER — Other Ambulatory Visit: Payer: Self-pay | Admitting: Family Medicine

## 2022-12-07 DIAGNOSIS — F419 Anxiety disorder, unspecified: Secondary | ICD-10-CM

## 2022-12-26 ENCOUNTER — Ambulatory Visit: Payer: BC Managed Care – PPO | Admitting: Family Medicine

## 2023-01-02 ENCOUNTER — Other Ambulatory Visit: Payer: Self-pay | Admitting: Family Medicine

## 2023-01-02 ENCOUNTER — Ambulatory Visit: Payer: BC Managed Care – PPO | Admitting: Family Medicine

## 2023-01-02 VITALS — BP 140/80 | HR 72 | Temp 97.7°F | Resp 18 | Ht 64.0 in | Wt 184.2 lb

## 2023-01-02 DIAGNOSIS — E785 Hyperlipidemia, unspecified: Secondary | ICD-10-CM | POA: Diagnosis not present

## 2023-01-02 DIAGNOSIS — Z23 Encounter for immunization: Secondary | ICD-10-CM | POA: Diagnosis not present

## 2023-01-02 DIAGNOSIS — D229 Melanocytic nevi, unspecified: Secondary | ICD-10-CM

## 2023-01-02 DIAGNOSIS — M5442 Lumbago with sciatica, left side: Secondary | ICD-10-CM

## 2023-01-02 DIAGNOSIS — I10 Essential (primary) hypertension: Secondary | ICD-10-CM | POA: Diagnosis not present

## 2023-01-02 DIAGNOSIS — R1013 Epigastric pain: Secondary | ICD-10-CM

## 2023-01-02 DIAGNOSIS — G8929 Other chronic pain: Secondary | ICD-10-CM

## 2023-01-02 MED ORDER — CYCLOBENZAPRINE HCL 10 MG PO TABS: 10.0000 mg | ORAL_TABLET | Freq: Three times a day (TID) | ORAL | 1 refills | Status: AC | PRN

## 2023-01-02 NOTE — Progress Notes (Unsigned)
Established Patient Office Visit  Subjective   Patient ID: [name], female    DOB: 04/06/1960  Age: 62 y.o. MRN: [id number]  Chief Complaint  Patient presents with   Hip Pain    Left hip, pt states pain is off and on. Pt states having sciatic pain.     HPI Discussed the use of AI scribe software for clinical note transcription with the patient, who gave verbal consent to proceed.  History of Present Illness   The patient, with a history of Sjogren's syndrome and retinal vasculitis, presents for blood work and to discuss hip pain and a skin lesion. She reports intermittent hip pain, which she initially thought was sciatica. The pain was severe enough to wake her up at night, but it is not currently bothering her. She also mentions a skin lesion on her left lower leg that has been present for several months. The lesion is raised and flaky, and it has grown over time.  In addition, the patient has been receiving treatment for retinal vasculitis, which has significantly improved her eyesight. She also mentions a history of bulging discs and previous severe back pain, which was managed by a chiropractor.      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   Sjogren's syndrome with keratoconjunctivitis sicca (HCC) 10/25/2020   Screening for tuberculosis 10/25/2020   Essential hypertension 10/09/2019   Other fatigue 10/09/2019   Elevated BP without diagnosis of hypertension 09/18/2019   Achilles tendon pain 01/29/2018   Left Achilles tendinitis 12/08/2015   Skin lesions 05/25/2015   Costochondritis 04/02/2015   Muscle spasm of back 07/31/2014   Chronically dry eyes 11/06/2013   Allergic dermatitis 11/06/2013    Contact dermatitis 09/16/2013   Neck pain on right side 09/02/2013   Nonallopathic lesion of cervical region 09/02/2013   Nonallopathic lesion of thoracic region 09/02/2013   Nonallopathic lesion of lumbosacral region 09/02/2013   Rotator cuff injury 02/07/2013   Plantar fasciitis 07/31/2012   Overweight (BMI 25.0-29.9) 07/31/2012   Allergic conjunctivitis 12/25/2011   Dysphagia 12/25/2011   Acute hip pain 07/31/2011   Low back pain radiating to right leg 02/06/2011   TMJ PAIN 03/29/2010   Alopecia 03/29/2010   Enlarged lymph nodes 03/04/2009   Nonspecific (abnormal) findings on radiological and other examination of body structure 01/22/2009   Abnormal CT of the chest 01/22/2009   Pain in joint 01/21/2009   Atypical chest pain 01/21/2009   ANHEDONIA 11/17/2008   FATIGUE 11/17/2008   Benign neoplasm of skin 03/19/2008   CONTACT DERMATITIS DUE TO METALS 02/25/2008   Hyperlipidemia 12/13/2007   ANEMIA-NOS 12/13/2007   History of colonic polyps 12/13/2007   DISTURBANCE OF SKIN SENSATION 05/28/2007   Past Medical History:  Diagnosis Date   Allergy    ALSO DRY EYES   Anemia    NOS   Bilateral hilar adenopathy syndrome 01/21/2009   Dr. Sherene Sires   Blood transfusion without reported diagnosis    years ago (2003)   GERD (gastroesophageal reflux disease)    History of colonic polyps    Hyperglycemia    Hyperlipidemia    Villous adenoma 1997   Past Surgical History:  Procedure Laterality Date   ABDOMINAL HYSTERECTOMY     &  USO for fibroid & servere anemia   APPENDECTOMY     COLONOSCOPY  2010   FETAL BLOOD TRANSFUSION  03/2001   pre tah   PARTIAL COLECTOMY  1997   for "precancerous "polyps , Dr Carollee Massed, [place]   TONSILLECTOMY     Social History   Tobacco Use   Smoking status: Former    Current packs/day: 0.00    Types: Cigarettes    Quit date: 03/27/2001    Years since quitting: 21.7   Smokeless tobacco: Never   Tobacco comments:    pt states only smoked socially and  unsure how many years she smoked  Vaping Use   Vaping status: Never Used  Substance Use Topics   Alcohol use: No   Drug use: Never   Social History   Socioeconomic History   Marital status: Single    Spouse name: Not on file   Number of children: Not on file   Years of education: Not on file   Highest education level: Master's degree (e.g., MA, MS, MEng, MEd, MSW, MBA)  Occupational History   Occupation: Magazine features editor: [employer]  Tobacco Use   Smoking status: Former    Current packs/day: 0.00    Types: Cigarettes    Quit date: 03/27/2001    Years since quitting: 21.7   Smokeless tobacco: Never   Tobacco comments:    pt states only smoked socially and unsure how many years she smoked  Vaping Use   Vaping status: Never Used  Substance and Sexual Activity   Alcohol use: No   Drug use: Never   Sexual activity: Not on file  Other Topics Concern   Not on file  Social History Narrative   Gets regular exercise         Social Determinants of Health   Financial Resource Strain: Low Risk  (01/02/2023)   Overall Financial Resource Strain (CARDIA)    Difficulty of Paying Living Expenses: Not hard at all  Food Insecurity: No Food Insecurity (01/02/2023)   Hunger Vital Sign    Worried About Running Out of Food in the Last Year: Never true    Ran Out of Food in the Last Year: Never true  Transportation Needs: No Transportation Needs (01/02/2023)   PRAPARE - Administrator, Civil Service (Medical): No    Lack of Transportation (Non-Medical): No  Physical Activity: Sufficiently Active (01/02/2023)   Exercise Vital Sign    Days of Exercise per Week: 5 days    Minutes of Exercise per Session: 30 min  Stress: Not on file  Social Connections: Unknown (01/02/2023)   Social Connection and Isolation Panel [NHANES]    Frequency of Communication with Friends and Family: Patient declined    Frequency of Social Gatherings with Friends and Family: Patient declined     Attends Religious Services: Patient declined    Database administrator or Organizations: Patient declined    Attends Engineer, structural: Not on file    Marital Status: Never married  Intimate Partner Violence: Not on file   Family Status  Relation Name Status   Father  (Not Specified)   Brother  (Not Specified)   Mat Aunt  (Not Specified)   Nutritional therapist  (Not Specified)   MGM  (Not Specified)   Mother  (Not Specified)   Neg Hx  (Not Specified)  No partnership data on file   Family History  Problem Relation Age of Onset  Colon cancer Father 80   Leukemia Father    Hyperlipidemia Brother    Hypertension Brother    Colon cancer Maternal Aunt    Diabetes Paternal Uncle    Asthma Maternal Grandmother    Hypertension Mother    Colon polyps Neg Hx    Esophageal cancer Neg Hx    Rectal cancer Neg Hx    Stomach cancer Neg Hx    Allergies  Allergen Reactions   Codeine     REACTION: GI UPSET      Review of Systems  Constitutional:  Negative for fever and malaise/fatigue.  HENT:  Negative for congestion.   Eyes:  Negative for blurred vision.  Respiratory:  Negative for cough and shortness of breath.   Cardiovascular:  Negative for chest pain, palpitations and leg swelling.  Gastrointestinal:  Negative for abdominal pain, blood in stool, nausea and vomiting.  Genitourinary:  Negative for dysuria and frequency.  Musculoskeletal:  Negative for back pain and falls.  Skin:  Negative for rash.  Neurological:  Negative for dizziness, loss of consciousness and headaches.  Endo/Heme/Allergies:  Negative for environmental allergies.  Psychiatric/Behavioral:  Negative for depression. The patient is not nervous/anxious.       Objective:     BP (!) 140/80 (BP Location: Left Arm, Patient Position: Sitting, Cuff Size: Large)   Pulse 72   Temp 97.7 F (36.5 C) (Oral)   Resp 18   Ht 5\' 4"  (1.626 m)   Wt 184 lb 3.2 oz (83.6 kg)   LMP 03/27/2001   SpO2 98%   BMI 31.62  kg/m  BP Readings from Last 3 Encounters:  01/02/23 (!) 140/80  04/18/22 (!) 154/92  11/08/21 120/82   Wt Readings from Last 3 Encounters:  01/02/23 184 lb 3.2 oz (83.6 kg)  04/18/22 184 lb (83.5 kg)  11/08/21 182 lb (82.6 kg)   SpO2 Readings from Last 3 Encounters:  01/02/23 98%  04/18/22 98%  11/08/21 97%      Physical Exam Vitals and nursing note reviewed.      Results for orders placed or performed in visit on 01/02/23  CBC with Differential/Platelet  Result Value Ref Range   WBC 5.2 4.0 - 10.5 K/uL   RBC 4.60 3.87 - 5.11 Mil/uL   Hemoglobin 13.6 12.0 - 15.0 g/dL   HCT 16.1 09.6 - 04.5 %   MCV 88.3 78.0 - 100.0 fl   MCHC 33.5 30.0 - 36.0 g/dL   RDW 40.9 81.1 - 91.4 %   Platelets 276.0 150.0 - 400.0 K/uL   Neutrophils Relative % 76.5 43.0 - 77.0 %   Lymphocytes Relative 11.4 (L) 12.0 - 46.0 %   Monocytes Relative 10.4 3.0 - 12.0 %   Eosinophils Relative 0.9 0.0 - 5.0 %   Basophils Relative 0.8 0.0 - 3.0 %   Neutro Abs 4.0 1.4 - 7.7 K/uL   Lymphs Abs 0.6 (L) 0.7 - 4.0 K/uL   Monocytes Absolute 0.5 0.1 - 1.0 K/uL   Eosinophils Absolute 0.0 0.0 - 0.7 K/uL   Basophils Absolute 0.0 0.0 - 0.1 K/uL  Comprehensive metabolic panel  Result Value Ref Range   Sodium 138 135 - 145 mEq/L   Potassium 4.3 3.5 - 5.1 mEq/L   Chloride 102 96 - 112 mEq/L   CO2 29 19 - 32 mEq/L   Glucose, Bld 96 70 - 99 mg/dL   BUN 16 6 - 23 mg/dL   Creatinine, Ser 7.82 0.40 - 1.20 mg/dL   Total  Bilirubin 1.1 0.2 - 1.2 mg/dL   Alkaline Phosphatase 57 39 - 117 U/L   AST 15 0 - 37 U/L   ALT 11 0 - 35 U/L   Total Protein 6.9 6.0 - 8.3 g/dL   Albumin 4.2 3.5 - 5.2 g/dL   GFR 82.95 (L) >62.13 mL/min   Calcium 9.7 8.4 - 10.5 mg/dL  Lipid panel  Result Value Ref Range   Cholesterol 282 (H) 0 - 200 mg/dL   Triglycerides 08.6 0.0 - 149.0 mg/dL   HDL 57.84 >69.62 mg/dL   VLDL 95.2 0.0 - 84.1 mg/dL   LDL Cholesterol 324 (H) 0 - 99 mg/dL   Total CHOL/HDL Ratio 3    NonHDL 199.77   TSH   Result Value Ref Range   TSH 2.68 0.35 - 5.50 uIU/mL  Rheumatoid Factor  Result Value Ref Range   Rheumatoid fact SerPl-aCnc <10 <14 IU/mL    Last CBC Lab Results  Component Value Date   WBC 5.2 01/02/2023   HGB 13.6 01/02/2023   HCT 40.7 01/02/2023   MCV 88.3 01/02/2023   RDW 14.6 01/02/2023   PLT 276.0 01/02/2023   Last metabolic panel Lab Results  Component Value Date   GLUCOSE 96 01/02/2023   NA 138 01/02/2023   K 4.3 01/02/2023   CL 102 01/02/2023   CO2 29 01/02/2023   BUN 16 01/02/2023   CREATININE 1.12 01/02/2023   GFR 52.67 (L) 01/02/2023   CALCIUM 9.7 01/02/2023   PROT 6.9 01/02/2023   ALBUMIN 4.2 01/02/2023   BILITOT 1.1 01/02/2023   ALKPHOS 57 01/02/2023   AST 15 01/02/2023   ALT 11 01/02/2023   Last lipids Lab Results  Component Value Date   CHOL 282 (H) 01/02/2023   HDL 82.60 01/02/2023   LDLCALC 184 (H) 01/02/2023   LDLDIRECT 145.6 12/08/2008   TRIG 78.0 01/02/2023   CHOLHDL 3 01/02/2023   Last hemoglobin A1c Lab Results  Component Value Date   HGBA1C 5.8 11/08/2021   Last thyroid functions Lab Results  Component Value Date   TSH 2.68 01/02/2023   Last vitamin D No results found for: "25OHVITD2", "25OHVITD3", "VD25OH" Last vitamin B12 and Folate Lab Results  Component Value Date   VITAMINB12 620 12/15/2019      The 10-year ASCVD risk score (Arnett DK, et al., 2019) is: 8.9%    Assessment & Plan:   Problem List Items Addressed This Visit       Unprioritized   Hyperlipidemia - Primary    Encourage heart healthy diet such as MIND or DASH diet, increase exercise, avoid trans fats, simple carbohydrates and processed foods, consider a krill or fish or flaxseed oil cap daily.        Relevant Orders   Comprehensive metabolic panel (Completed)   Lipid panel (Completed)   TSH (Completed)   Essential hypertension    Encourage heart healthy diet such as MIND or DASH diet, increase exercise, avoid trans fats, simple carbohydrates  and processed foods, consider a krill or fish or flaxseed oil cap daily.        Relevant Orders   CBC with Differential/Platelet (Completed)   Comprehensive metabolic panel (Completed)   TSH (Completed)   Other Visit Diagnoses     Suspicious nevus       Relevant Orders   Ambulatory referral to Dermatology   Need for tetanus booster       Relevant Orders   Tdap vaccine greater than or equal to 7yo IM (Completed)  Chronic left-sided low back pain with left-sided sciatica       Relevant Medications   cyclobenzaprine (FLEXERIL) 10 MG tablet   Other Relevant Orders   Rheumatoid Factor (Completed)   Acute left-sided low back pain with left-sided sciatica       Relevant Medications   cyclobenzaprine (FLEXERIL) 10 MG tablet     Assessment and Plan    Lower Back Pain Recent exacerbation of chronic lower back pain, possibly related to bulging discs. No acute distress today. History of successful chiropractic treatment. -Consider returning to chiropractor for adjustments. -Continue as needed use of Flexeril for muscle relaxation. -Refill Flexeril prescription.  Retinal Vasculitis Improved vision following recent injection. Discussion of potential initiation of Humira by ophthalmologist. -Continue current eye treatment regimen.  Skin Lesion New raised, flaky lesion on left lower leg of several months duration. Dermatology appointment scheduled for April, but patient is concerned about potential malignancy. -Attempt to expedite dermatology appointment for evaluation of lesion.  Routine Blood Work Patient requested routine blood work. -Order routine blood work.  Medication Review Patient currently taking azathioprine 50mg  daily, famotidine daily, and prednisolone acetate eye drops as needed. Discontinued rosuvastatin due to side effects. -Refill Xanax prescription.  Immunizations Recent influenza vaccine received. No recent tetanus vaccine. -Administer tetanus vaccine today.         No follow-ups on file.    Donato Schultz, DO

## 2023-01-03 LAB — LIPID PANEL
Cholesterol: 282 mg/dL — ABNORMAL HIGH (ref 0–200)
HDL: 82.6 mg/dL (ref >39.00)
LDL Cholesterol: 184 mg/dL — ABNORMAL HIGH (ref 0–99)
NonHDL: 199.77
Total CHOL/HDL Ratio: 3
Triglycerides: 78 mg/dL (ref 0.0–149.0)
VLDL: 15.6 mg/dL (ref 0.0–40.0)

## 2023-01-03 LAB — COMPREHENSIVE METABOLIC PANEL
ALT: 11 U/L (ref 0–35)
AST: 15 U/L (ref 0–37)
Albumin: 4.2 g/dL (ref 3.5–5.2)
Alkaline Phosphatase: 57 U/L (ref 39–117)
BUN: 16 mg/dL (ref 6–23)
CO2: 29 meq/L (ref 19–32)
Calcium: 9.7 mg/dL (ref 8.4–10.5)
Chloride: 102 meq/L (ref 96–112)
Creatinine, Ser: 1.12 mg/dL (ref 0.40–1.20)
GFR: 52.67 mL/min — ABNORMAL LOW (ref >60.00)
Glucose, Bld: 96 mg/dL (ref 70–99)
Potassium: 4.3 meq/L (ref 3.5–5.1)
Sodium: 138 meq/L (ref 135–145)
Total Bilirubin: 1.1 mg/dL (ref 0.2–1.2)
Total Protein: 6.9 g/dL (ref 6.0–8.3)

## 2023-01-03 LAB — CBC WITH DIFFERENTIAL/PLATELET
Basophils Absolute: 0 10*3/uL (ref 0.0–0.1)
Basophils Relative: 0.8 % (ref 0.0–3.0)
Eosinophils Absolute: 0 10*3/uL (ref 0.0–0.7)
Eosinophils Relative: 0.9 % (ref 0.0–5.0)
HCT: 40.7 % (ref 36.0–46.0)
Hemoglobin: 13.6 g/dL (ref 12.0–15.0)
Lymphocytes Relative: 11.4 % — ABNORMAL LOW (ref 12.0–46.0)
Lymphs Abs: 0.6 10*3/uL — ABNORMAL LOW (ref 0.7–4.0)
MCHC: 33.5 g/dL (ref 30.0–36.0)
MCV: 88.3 fL (ref 78.0–100.0)
Monocytes Absolute: 0.5 10*3/uL (ref 0.1–1.0)
Monocytes Relative: 10.4 % (ref 3.0–12.0)
Neutro Abs: 4 10*3/uL (ref 1.4–7.7)
Neutrophils Relative %: 76.5 % (ref 43.0–77.0)
Platelets: 276 10*3/uL (ref 150.0–400.0)
RBC: 4.6 Mil/uL (ref 3.87–5.11)
RDW: 14.6 % (ref 11.5–15.5)
WBC: 5.2 10*3/uL (ref 4.0–10.5)

## 2023-01-03 LAB — RHEUMATOID FACTOR: Rheumatoid fact SerPl-aCnc: <10 [IU]/mL (ref <14)

## 2023-01-03 LAB — TSH: TSH: 2.68 u[IU]/mL (ref 0.35–5.50)

## 2023-01-03 NOTE — Assessment & Plan Note (Signed)
Encourage heart healthy diet such as MIND or DASH diet, increase exercise, avoid trans fats, simple carbohydrates and processed foods, consider a krill or fish or flaxseed oil cap daily.  °

## 2023-02-28 ENCOUNTER — Encounter: Payer: Self-pay | Admitting: Family Medicine

## 2023-02-28 ENCOUNTER — Ambulatory Visit: Payer: BC Managed Care – PPO | Admitting: Family Medicine

## 2023-02-28 VITALS — BP 128/69 | HR 84 | Ht 64.0 in | Wt 185.0 lb

## 2023-02-28 DIAGNOSIS — M62830 Muscle spasm of back: Secondary | ICD-10-CM

## 2023-02-28 MED ORDER — MELOXICAM 15 MG PO TABS
15.0000 mg | ORAL_TABLET | Freq: Every day | ORAL | 0 refills | Status: AC
Start: 2023-02-28 — End: ?

## 2023-02-28 NOTE — Patient Instructions (Addendum)
Spasm/tension of thoracic back muscle under shoulder blade Recommend rest, ice, heat, massage, home stretching Adding Meloxicam (do not combine with other antiinflammatories like ibuprofen or aleve) Okay to use your muscle relaxer If not improving, recommend physical therapy for soft tissue modalities and dry needling.

## 2023-02-28 NOTE — Progress Notes (Signed)
Acute Office Visit  Subjective:     Patient ID: [name], female    DOB: 24-Apr-1960, 62 y.o.   MRN: [id number]  CC: back pain    Patient is in today for back pain.   Discussed the use of AI scribe software for clinical note transcription with the patient, who gave verbal consent to proceed.  History of Present Illness   The patient presents with a sudden onset of severe, stabbing mid/upper back pain that started earlier today. They describe the pain as if a muscle is being twisted tightly. The pain is located in the left upper to mid back and radiates around to the side. The patient has a history of sciatica on the same side. The pain was precipitated by bending and moving around while getting ready in the morning. The patient denies any associated symptoms such as difficulty urinating, shortness of breath, or chest pain. The pain is exacerbated by certain movements and rotation of the back. The patient has not yet tried any interventions for the pain, but did consume an Catering manager for potential relief. The patient also reports frequent burping and passing gas, but denies any difficulty with bowel movements or urination. The patient has a history of muscle pain in the elbow, which they attribute to frequent lifting and pulling movements. The patient spends a significant amount of time working on a computer.             All review of systems negative except what is listed in the HPI      Objective:    BP 128/69   Pulse 84   Ht 5\' 4"  (1.626 m)   Wt 185 lb (83.9 kg)   LMP 03/27/2001   SpO2 98%   BMI 31.76 kg/m    Physical Exam Vitals reviewed.  Constitutional:      Appearance: Normal appearance.  Cardiovascular:     Rate and Rhythm: Normal rate and regular rhythm.  Pulmonary:     Effort: Pulmonary effort is normal.     Breath sounds: Normal breath sounds.  Abdominal:     Tenderness: There is no right CVA tenderness or left CVA tenderness.  Musculoskeletal:         General: Normal range of motion.       Back:  Skin:    General: Skin is warm and dry.  Neurological:     Mental Status: She is alert and oriented to person, place, and time.  Psychiatric:        Mood and Affect: Mood normal.        Behavior: Behavior normal.        Thought Content: Thought content normal.        Judgment: Judgment normal.     No results found for any visits on 02/28/23.      Assessment & Plan:   Problem List Items Addressed This Visit   None Visit Diagnoses     Spasm of thoracic back muscle    -  Primary   Relevant Medications   meloxicam (MOBIC) 15 MG tablet     Spasm/tension of thoracic back muscle under shoulder blade Recommend rest, ice, heat, massage, home stretching Adding Meloxicam (do not combine with other antiinflammatories like ibuprofen or aleve) Okay to use your muscle relaxer If not improving, recommend physical therapy for soft tissue modalities and dry needling.   Patient aware of signs/symptoms requiring further/urgent evaluation.   Meds ordered this encounter  Medications   meloxicam (  MOBIC) 15 MG tablet    Sig: Take 1 tablet (15 mg total) by mouth daily.    Dispense:  30 tablet    Refill:  0    Order Specific Question:   Supervising Provider    Answer:   Danise Edge A [[id number]]    Return if symptoms worsen or fail to improve.  Clayborne Dana, NP

## 2023-05-31 ENCOUNTER — Encounter: Payer: Self-pay | Admitting: Family Medicine

## 2023-05-31 ENCOUNTER — Ambulatory Visit: Admitting: Family Medicine

## 2023-05-31 VITALS — BP 120/80 | HR 77 | Temp 97.7°F | Resp 18 | Ht 64.0 in | Wt 180.4 lb

## 2023-05-31 DIAGNOSIS — K529 Noninfective gastroenteritis and colitis, unspecified: Secondary | ICD-10-CM

## 2023-05-31 MED ORDER — ONDANSETRON HCL 4 MG PO TABS: 4.0000 mg | ORAL_TABLET | Freq: Three times a day (TID) | ORAL | 0 refills | Status: AC | PRN

## 2023-05-31 MED ORDER — DICYCLOMINE HCL 10 MG PO CAPS: 10.0000 mg | ORAL_CAPSULE | Freq: Three times a day (TID) | ORAL | 0 refills | Status: AC

## 2023-05-31 NOTE — Progress Notes (Signed)
 Established Patient Office Visit  Subjective   Patient ID: [name], female    DOB: 25-Sep-1960  Age: 63 y.o. MRN: [id number]  Chief Complaint  Patient presents with   GI Problem    Sxs started Sunday evening, pt states having nausea/vomiting and started having leg cramps with fatigue. Pt states diarrhea started yesterday     HPI. Discussed the use of AI scribe software for clinical note transcription with the patient, who gave verbal consent to proceed.  History of Present Illness   [name] is a 63 year old female who presents with vomiting and diarrhea.  She began experiencing vomiting on Sunday evening, which continued into Monday morning. The vomiting was projectile in nature. She also experienced severe leg cramps, which she attributes to dehydration from the vomiting. By Tuesday, the vomiting had resolved, but she remained weak and sore.  On Wednesday, she developed diarrhea, which has persisted into Thursday. She experienced diarrhea at least a dozen times on Wednesday and continues to have frequent episodes. She has not taken any medication for the diarrhea as she was unsure of what to take. No significant abdominal pain or cramping, but she notes a sensation of anxiety before needing to use the bathroom.  She recalls having a low-grade fever on Sunday when she first became ill, accompanied by chills. She experienced nausea on Tuesday but has not vomited since then. She used a forehead thermometer to check her temperature when she felt feverish but did not record a specific temperature.  She has been consuming chicken soup, broth, rice, toast, and bananas to manage her symptoms. She has been drinking Gatorlite with electrolytes and magnesium, along with water and broth, to stay hydrated. She has not eaten out or traveled recently.  She suspects she may have contracted the illness from someone at work, where others have been sick. She has not been on any new medications  recently, continuing her usual injections. She had a benign growth removed from her leg in January.       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   Sjogren's syndrome with keratoconjunctivitis sicca (HCC) 10/25/2020   Screening for tuberculosis 10/25/2020   Essential hypertension 10/09/2019   Other fatigue 10/09/2019   Elevated BP without diagnosis of hypertension 09/18/2019   Achilles tendon pain 01/29/2018   Left Achilles tendinitis 12/08/2015   Skin lesions 05/25/2015   Costochondritis 04/02/2015   Muscle spasm of back 07/31/2014   Chronically dry eyes 11/06/2013   Allergic dermatitis 11/06/2013   Contact dermatitis 09/16/2013   Neck pain on right side 09/02/2013   Nonallopathic lesion of cervical region 09/02/2013   Nonallopathic lesion of thoracic region 09/02/2013   Nonallopathic lesion of lumbosacral region 09/02/2013   Rotator cuff injury 02/07/2013   Plantar fasciitis 07/31/2012   Overweight (BMI 25.0-29.9) 07/31/2012   Allergic conjunctivitis 12/25/2011   Dysphagia 12/25/2011   Acute hip pain 07/31/2011   Low back pain radiating to right leg 02/06/2011   TMJ PAIN 03/29/2010   Alopecia 03/29/2010   Enlarged lymph nodes 03/04/2009   Nonspecific (abnormal) findings on radiological and other examination of body structure 01/22/2009   Abnormal CT of the chest 01/22/2009   Pain in  joint 01/21/2009   Atypical chest pain 01/21/2009   ANHEDONIA 11/17/2008   FATIGUE 11/17/2008   Benign neoplasm of skin 03/19/2008   CONTACT DERMATITIS DUE TO METALS 02/25/2008   Hyperlipidemia 12/13/2007   ANEMIA-NOS 12/13/2007   History of colonic polyps 12/13/2007   DISTURBANCE OF SKIN SENSATION 05/28/2007    Past Medical History:  Diagnosis Date   Allergy    ALSO DRY EYES   Anemia    NOS   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 Sherene Sires   Blood transfusion without reported diagnosis    years ago (2003)   GERD (gastroesophageal reflux disease)    History of colonic polyps    Hyperglycemia    Hyperlipidemia    Villous adenoma 1997   Past Surgical History:  Procedure Laterality Date   ABDOMINAL HYSTERECTOMY     &USO for fibroid & servere anemia   APPENDECTOMY     COLONOSCOPY  2010   FETAL BLOOD TRANSFUSION  03/2001   pre tah   PARTIAL COLECTOMY  1997   for "precancerous "polyps , Dr Carollee Massed, [place]   TONSILLECTOMY     Social History   Tobacco Use   Smoking status: Former    Current packs/day: 0.00    Types: Cigarettes    Quit date: 03/27/2001    Years since quitting: 22.1   Smokeless tobacco: Never   Tobacco comments:    pt states only smoked socially and unsure how many years she smoked  Vaping Use   Vaping status: Never Used  Substance Use Topics   Alcohol use: No   Drug use: Never   Social History   Socioeconomic History   Marital status: Single    Spouse name: Not on file   Number of children: Not on file   Years of education: Not on file   Highest education level: Master's degree (e.g., MA, MS, MEng, MEd, MSW, MBA)  Occupational History   Occupation: Magazine features editor: [employer]  Tobacco Use   Smoking status: Former    Current packs/day: 0.00    Types: Cigarettes    Quit date: 03/27/2001    Years since quitting: 22.1   Smokeless tobacco: Never   Tobacco comments:    pt states only smoked socially and unsure how many years she smoked  Vaping Use   Vaping status: Never Used  Substance and Sexual Activity   Alcohol use: No   Drug use: Never   Sexual activity: Not on file  Other Topics Concern   Not on file  Social History Narrative   Gets regular exercise         Social Drivers of Health   Financial Resource Strain:  Low Risk  (01/02/2023)   Overall Financial Resource Strain (CARDIA)    Difficulty of Paying Living Expenses: Not hard at all  Food Insecurity: No Food Insecurity (01/02/2023)   Hunger Vital Sign    Worried About Running Out of Food in the Last Year: Never true    Ran Out of Food in the Last Year: Never true  Transportation Needs: No Transportation Needs (01/02/2023)   PRAPARE - Administrator, Civil Service (Medical): No    Lack of Transportation (Non-Medical): No  Physical Activity: Sufficiently Active (01/02/2023)   Exercise Vital Sign    Days of Exercise per Week: 5 days    Minutes of Exercise per Session: 30 min  Stress: Not on file  Social Connections: Unknown (01/02/2023)  Social Advertising account executive [NHANES]    Frequency of Communication with Friends and Family: Patient declined    Frequency of Social Gatherings with Friends and Family: Patient declined    Attends Religious Services: Patient declined    Database administrator or Organizations: Patient declined    Attends Engineer, structural: Not on file    Marital Status: Never married  Intimate Partner Violence: Not on file   Family Status  Relation Name Status   Father  (Not Specified)   Brother  (Not Specified)   Youth worker  (Not Specified)   Nutritional therapist  (Not Specified)   MGM  (Not Specified)   Mother  (Not Specified)   Neg Hx  (Not Specified)  No partnership data on file   Family History  Problem Relation Age of Onset   Colon cancer Father 89   Leukemia Father    Hyperlipidemia Brother    Hypertension Brother    Colon cancer Maternal Aunt    Diabetes Paternal Uncle    Asthma Maternal Grandmother    Hypertension Mother    Colon polyps Neg Hx    Esophageal cancer Neg Hx    Rectal cancer Neg Hx    Stomach cancer Neg Hx    Allergies  Allergen Reactions   Codeine     REACTION: GI UPSET      Review of Systems  Constitutional:  Negative for fever and malaise/fatigue.  HENT:   Negative for congestion.   Eyes:  Negative for blurred vision.  Respiratory:  Negative for cough and shortness of breath.   Cardiovascular:  Negative for chest pain, palpitations and leg swelling.  Gastrointestinal:  Positive for diarrhea, nausea and vomiting.  Musculoskeletal:  Negative for back pain.  Skin:  Negative for rash.  Neurological:  Negative for loss of consciousness and headaches.      Objective:     BP 120/80 (BP Location: Left Arm, Patient Position: Sitting, Cuff Size: Normal)   Pulse 77   Temp 97.7 F (36.5 C) (Oral)   Resp 18   Ht 5\' 4"  (1.626 m)   Wt 180 lb 6.4 oz (81.8 kg)   LMP 03/27/2001   SpO2 97%   BMI 30.97 kg/m  BP Readings from Last 3 Encounters:  05/31/23 120/80  02/28/23 128/69  01/02/23 (!) 140/80   Wt Readings from Last 3 Encounters:  05/31/23 180 lb 6.4 oz (81.8 kg)  02/28/23 185 lb (83.9 kg)  01/02/23 184 lb 3.2 oz (83.6 kg)   SpO2 Readings from Last 3 Encounters:  05/31/23 97%  02/28/23 98%  01/02/23 98%      Physical Exam Vitals and nursing note reviewed.  Constitutional:      General: She is not in acute distress.    Appearance: Normal appearance. She is well-developed.  HENT:     Head: Normocephalic and atraumatic.  Eyes:     General: No scleral icterus.       Right eye: No discharge.        Left eye: No discharge.  Cardiovascular:     Rate and Rhythm: Normal rate and regular rhythm.     Heart sounds: No murmur heard. Pulmonary:     Effort: Pulmonary effort is normal. No respiratory distress.     Breath sounds: Normal breath sounds.  Musculoskeletal:        General: Normal range of motion.     Cervical back: Normal range of motion and neck supple.  Right lower leg: No edema.     Left lower leg: No edema.  Skin:    General: Skin is warm and dry.  Neurological:     Mental Status: She is alert and oriented to person, place, and time.  Psychiatric:        Mood and Affect: Mood normal.        Behavior: Behavior  normal.        Thought Content: Thought content normal.        Judgment: Judgment normal.      No results found for any visits on 05/31/23.  Last CBC Lab Results  Component Value Date   WBC 5.2 01/02/2023   HGB 13.6 01/02/2023   HCT 40.7 01/02/2023   MCV 88.3 01/02/2023   RDW 14.6 01/02/2023   PLT 276.0 01/02/2023   Last metabolic panel Lab Results  Component Value Date   GLUCOSE 96 01/02/2023   NA 138 01/02/2023   K 4.3 01/02/2023   CL 102 01/02/2023   CO2 29 01/02/2023   BUN 16 01/02/2023   CREATININE 1.12 01/02/2023   GFR 52.67 (L) 01/02/2023   CALCIUM 9.7 01/02/2023   PROT 6.9 01/02/2023   ALBUMIN 4.2 01/02/2023   BILITOT 1.1 01/02/2023   ALKPHOS 57 01/02/2023   AST 15 01/02/2023   ALT 11 01/02/2023   Last lipids Lab Results  Component Value Date   CHOL 282 (H) 01/02/2023   HDL 82.60 01/02/2023   LDLCALC 184 (H) 01/02/2023   LDLDIRECT 145.6 12/08/2008   TRIG 78.0 01/02/2023   CHOLHDL 3 01/02/2023   Last hemoglobin A1c Lab Results  Component Value Date   HGBA1C 5.8 11/08/2021   Last thyroid functions Lab Results  Component Value Date   TSH 2.68 01/02/2023   Last vitamin D No results found for: "25OHVITD2", "25OHVITD3", "VD25OH" Last vitamin B12 and Folate Lab Results  Component Value Date   VITAMINB12 620 12/15/2019      The 10-year ASCVD risk score (Arnett DK, et al., 2019) is: 6%    Assessment & Plan:   Problem List Items Addressed This Visit   None Visit Diagnoses       Gastroenteritis    -  Primary   Relevant Medications   dicyclomine (BENTYL) 10 MG capsule   ondansetron (ZOFRAN) 4 MG tablet   Other Relevant Orders   CBC with Differential/Platelet   Comprehensive metabolic panel   Cdiff NAA+O+P+Stool Culture     Assessment and Plan    Acute Gastroenteritis   She presents with symptoms of acute gastroenteritis, including projectile vomiting on Sunday evening and Monday morning, followed by severe leg cramps likely due to  dehydration. By Wednesday, she developed diarrhea, experiencing up to a dozen episodes per day. There is no significant abdominal pain or cramping, and no recent travel or exposure to potentially contaminated food. The differential diagnosis includes viral gastroenteritis, food poisoning, or a parasitic infection. The absence of fever and self-limiting nature of symptoms suggest a viral etiology, possibly norovirus, consistent with recent outbreaks. Hydration is managed with Gatorlite and broth, with emphasis on slow rehydration to prevent symptom exacerbation. If symptoms persist despite medication, emergency care may be necessary for IV fluids. Prescribe Bentyl for diarrhea and cramping, up to four times a day as needed, and Zofran for nausea as needed. Order stool cultures if diarrhea remains watery to rule out parasitic infection and blood work to check white blood cell count. Advise on dietary management with the BRAT diet and hydration  with Pedialyte ice pops or Gatorade. Educate on using Pedialyte and Gatorade for no more than three days to avoid worsening diarrhea. Instruct to seek emergency care if symptoms persist despite medication, indicating potential need for IV fluids.        No follow-ups on file.    Donato Schultz, DO

## 2023-05-31 NOTE — Patient Instructions (Signed)
 Food Choices to Help Relieve Diarrhea, Adult Diarrhea can make you feel weak and cause you to become dehydrated. Dehydration is a condition in which there is not enough water or other fluids in the body. It is important to choose the right foods and drinks to: Relieve diarrhea. Replace lost fluids and nutrients. Prevent dehydration. What are tips for following this plan? Relieving diarrhea Avoid foods that make your diarrhea worse. These may include: Foods and drinks that are sweetened with high-fructose corn syrup, honey, or sweeteners such as xylitol, sorbitol, and mannitol. Check food labels for these ingredients. Fried, greasy, or spicy foods. Raw fruits and vegetables. Eat foods that are rich in probiotics. These include foods such as yogurt and fermented milk products. Probiotics can help increase healthy bacteria in your stomach and intestines (gastrointestinal or GI tract). This may help digestion and stop diarrhea. If you have lactose intolerance, avoid dairy products. These may make your diarrhea worse. Take medicine to help stop diarrhea only as told by your health care provider. Replacing nutrients  Eat bland, easy-to-digest foods in small amounts as you are able, until your diarrhea starts to get better. These foods include bananas, applesauce, rice, toast, and crackers. Over time, add nutrient-rich foods as your body tolerates them or as told by your health care provider. These include: Well-cooked protein foods, such as eggs, lean meats like fish or chicken without skin, and tofu. Peeled, seeded, and soft-cooked fruits and vegetables. Low-fat dairy products. Whole grains. Take vitamin and mineral supplements as told by your health care provider. Preventing dehydration  Start by sipping water or a solution to prevent dehydration (oral rehydration solution, or ORS). This is a drink that helps replace fluids and minerals your body has lost. You can buy an ORS at pharmacies and  retail stores. Try to drink at least 8-10 cups (2,000-2,500 mL) of fluid each day to help replace lost fluids. If your urine is pale yellow, you are getting enough fluids. You may drink other liquids in addition to water, such as fruit juice that you have added water to (diluted fruit juice) or low-calorie sports drinks, as tolerated or as told by your health care provider. Avoid drinks with caffeine, such as coffee, tea, or soft drinks. Avoid alcohol. This information is not intended to replace advice given to you by your health care provider. Make sure you discuss any questions you have with your health care provider. Document Revised: 08/30/2021 Document Reviewed: 08/30/2021 Elsevier Patient Education  2024 [employer].

## 2023-06-01 LAB — CBC WITH DIFFERENTIAL/PLATELET
Basophils Absolute: 0 10*3/uL (ref 0.0–0.1)
Basophils Relative: 0.9 % (ref 0.0–3.0)
Eosinophils Absolute: 0 10*3/uL (ref 0.0–0.7)
Eosinophils Relative: 0.9 % (ref 0.0–5.0)
HCT: 43.2 % (ref 36.0–46.0)
Hemoglobin: 14.9 g/dL (ref 12.0–15.0)
Lymphocytes Relative: 20.7 % (ref 12.0–46.0)
Lymphs Abs: 0.7 10*3/uL (ref 0.7–4.0)
MCHC: 34.5 g/dL (ref 30.0–36.0)
MCV: 88.5 fl (ref 78.0–100.0)
Monocytes Absolute: 0.5 10*3/uL (ref 0.1–1.0)
Monocytes Relative: 14.7 % — ABNORMAL HIGH (ref 3.0–12.0)
Neutro Abs: 2.1 10*3/uL (ref 1.4–7.7)
Neutrophils Relative %: 62.8 % (ref 43.0–77.0)
Platelets: 282 10*3/uL (ref 150.0–400.0)
RBC: 4.89 Mil/uL (ref 3.87–5.11)
RDW: 14.2 % (ref 11.5–15.5)
WBC: 3.4 10*3/uL — ABNORMAL LOW (ref 4.0–10.5)

## 2023-06-01 LAB — COMPREHENSIVE METABOLIC PANEL
ALT: 24 U/L (ref 0–35)
AST: 31 U/L (ref 0–37)
Albumin: 4.2 g/dL (ref 3.5–5.2)
Alkaline Phosphatase: 41 U/L (ref 39–117)
BUN: 12 mg/dL (ref 6–23)
CO2: 27 meq/L (ref 19–32)
Calcium: 9 mg/dL (ref 8.4–10.5)
Chloride: 102 meq/L (ref 96–112)
Creatinine, Ser: 1.08 mg/dL (ref 0.40–1.20)
GFR: 54.87 mL/min — ABNORMAL LOW (ref >60.00)
Glucose, Bld: 85 mg/dL (ref 70–99)
Potassium: 4.7 meq/L (ref 3.5–5.1)
Sodium: 136 meq/L (ref 135–145)
Total Bilirubin: 1.3 mg/dL — ABNORMAL HIGH (ref 0.2–1.2)
Total Protein: 7.4 g/dL (ref 6.0–8.3)

## 2023-06-09 ENCOUNTER — Encounter: Payer: Self-pay | Admitting: Family Medicine

## 2023-10-01 ENCOUNTER — Other Ambulatory Visit: Payer: Self-pay | Admitting: Family Medicine

## 2023-10-01 DIAGNOSIS — Z1231 Encounter for screening mammogram for malignant neoplasm of breast: Secondary | ICD-10-CM

## 2023-10-25 ENCOUNTER — Ambulatory Visit

## 2023-10-25 DIAGNOSIS — Z1231 Encounter for screening mammogram for malignant neoplasm of breast: Secondary | ICD-10-CM | POA: Diagnosis not present

## 2023-11-03 ENCOUNTER — Other Ambulatory Visit: Payer: Self-pay | Admitting: Family Medicine

## 2023-11-03 DIAGNOSIS — R1013 Epigastric pain: Secondary | ICD-10-CM

## 2024-02-13 ENCOUNTER — Ambulatory Visit: Admitting: Family Medicine

## 2024-02-13 ENCOUNTER — Encounter: Payer: Self-pay | Admitting: Family Medicine

## 2024-02-13 VITALS — BP 130/82 | HR 84 | Temp 98.0°F | Resp 16 | Ht 64.0 in | Wt 175.4 lb

## 2024-02-13 DIAGNOSIS — R059 Cough, unspecified: Secondary | ICD-10-CM | POA: Diagnosis not present

## 2024-02-13 DIAGNOSIS — J189 Pneumonia, unspecified organism: Secondary | ICD-10-CM | POA: Diagnosis not present

## 2024-02-13 LAB — POC COVID19 BINAXNOW: SARS Coronavirus 2 Ag: NEGATIVE

## 2024-02-13 LAB — POCT INFLUENZA A/B
Influenza A, POC: NEGATIVE
Influenza B, POC: NEGATIVE

## 2024-02-13 MED ORDER — AZITHROMYCIN 250 MG PO TABS
ORAL_TABLET | ORAL | 0 refills | Status: AC
Start: 2024-02-13 — End: ?

## 2024-02-13 MED ORDER — FLUCONAZOLE 150 MG PO TABS: ORAL_TABLET | ORAL | 0 refills | Status: AC

## 2024-02-13 NOTE — Progress Notes (Signed)
 Chief Complaint  Patient presents with   Cough    Cough    [name] Lunger here for URI complaints.  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 Darlean   Blood transfusion without reported diagnosis    years ago (2003)   GERD (gastroesophageal reflux disease)    History of colonic polyps    Hyperglycemia    Hyperlipidemia    Villous adenoma 1997    Objective BP 130/82 (BP Location: Left Arm, Patient Position: Sitting)   Pulse 84   Temp 98 F (36.7 C) (Oral)   Resp 16   Ht 5' 4 (1.626 m)   Wt 175 lb 6.4 oz (79.6 kg)   LMP 03/27/2001   SpO2 98%   BMI 30.11 kg/m  General: Awake, alert, appears stated age HEENT: AT, [place], ears patent b/l and TM's neg, nares patent w/o discharge, pharynx pink and without exudates, MMM, no sinus ttp Neck: No masses or asymmetry Heart: RRR Lungs: CTAB, no accessory muscle use Psych: Age appropriate judgment and insight, normal mood and affect  Walking pneumonia - Plan: azithromycin  (ZITHROMAX ) 250 MG tablet, fluconazole (DIFLUCAN) 150 MG tablet, POCT Influenza A/B, POC COVID-19  Cough, unspecified type - Plan: POCT Influenza A/B, POC COVID-19  Flu and COVID testing negative.  Given duration and worsening symptoms, will treat with a Z-Pak for walking pneumonia, Diflucan as needed.  Letter for work provided today.  Continue to push fluids, practice good hand hygiene, cover mouth when coughing. F/u prn. If starting to experience fevers, shaking, or shortness of breath, seek immediate care. Pt voiced understanding and agreement to the plan.  Mabel [place], DO 02/13/24 11:44 AM

## 2024-02-13 NOTE — Patient Instructions (Signed)
 Continue to push fluids, practice good hand hygiene, and cover your mouth if you cough. ? ?If you start having fevers, shaking or shortness of breath, seek immediate care. ? ?OK to take Tylenol 1000 mg (2 extra strength tabs) or 975 mg (3 regular strength tabs) every 6 hours as needed. ? ?Let us know if you need anything. ?

## 2024-03-11 ENCOUNTER — Other Ambulatory Visit: Payer: Self-pay | Admitting: Family Medicine

## 2024-03-11 DIAGNOSIS — R1013 Epigastric pain: Secondary | ICD-10-CM
